# Patient Record
Sex: Male | Born: 1965 | Hispanic: Yes | Marital: Married | Smoking: Never smoker
Health system: Southern US, Community
[De-identification: ages and names within clinical notes are randomized; demographics above are authoritative.]

## PROBLEM LIST (undated history)

## (undated) DIAGNOSIS — I10 Essential (primary) hypertension: Secondary | ICD-10-CM

## (undated) DIAGNOSIS — R079 Chest pain, unspecified: Secondary | ICD-10-CM

## (undated) DIAGNOSIS — T7840XA Allergy, unspecified, initial encounter: Secondary | ICD-10-CM

## (undated) DIAGNOSIS — K219 Gastro-esophageal reflux disease without esophagitis: Secondary | ICD-10-CM

## (undated) DIAGNOSIS — U071 COVID-19: Secondary | ICD-10-CM

## (undated) DIAGNOSIS — E785 Hyperlipidemia, unspecified: Secondary | ICD-10-CM

## (undated) DIAGNOSIS — R0602 Shortness of breath: Secondary | ICD-10-CM

## (undated) DIAGNOSIS — A048 Other specified bacterial intestinal infections: Secondary | ICD-10-CM

## (undated) HISTORY — DX: Gastro-esophageal reflux disease without esophagitis: K21.9

## (undated) HISTORY — DX: Shortness of breath: R06.02

## (undated) HISTORY — DX: Essential (primary) hypertension: I10

## (undated) HISTORY — DX: Other specified bacterial intestinal infections: A04.8

## (undated) HISTORY — DX: Chest pain, unspecified: R07.9

## (undated) HISTORY — DX: Hyperlipidemia, unspecified: E78.5

## (undated) HISTORY — DX: Allergy, unspecified, initial encounter: T78.40XA

## (undated) HISTORY — DX: COVID-19: U07.1

---

## 2009-04-30 ENCOUNTER — Emergency Department (HOSPITAL_COMMUNITY): Admission: EM | Admit: 2009-04-30 | Discharge: 2009-04-30 | Payer: Self-pay | Admitting: Family Medicine

## 2012-05-12 ENCOUNTER — Ambulatory Visit: Payer: Self-pay | Admitting: Emergency Medicine

## 2012-05-12 VITALS — BP 124/76 | HR 107 | Temp 97.5°F | Resp 16 | Ht 66.0 in | Wt 182.0 lb

## 2012-05-12 DIAGNOSIS — G5680 Other specified mononeuropathies of unspecified upper limb: Secondary | ICD-10-CM

## 2012-05-12 MED ORDER — NAPROXEN SODIUM 550 MG PO TABS: 550.0000 mg | ORAL_TABLET | Freq: Two times a day (BID) | ORAL | Status: AC

## 2012-05-12 MED ORDER — TRAMADOL HCL 50 MG PO TABS: 50.0000 mg | ORAL_TABLET | Freq: Four times a day (QID) | ORAL | Status: DC | PRN

## 2012-05-12 MED ORDER — CYCLOBENZAPRINE HCL 10 MG PO TABS: 10.0000 mg | ORAL_TABLET | Freq: Three times a day (TID) | ORAL | Status: DC | PRN

## 2012-05-12 NOTE — Progress Notes (Signed)
   Date:  05/12/2012   Name:  Barry Fry   DOB:  11/19/1965   MRN:  [id number] Gender: male Age: 47 y.o.  PCP:  No primary provider on file.    Chief Complaint: Shoulder Pain and Headache   History of Present Illness:  Barry Fry is a 46 y.o. pleasant patient who presents with the following:  Awoke this am with pain in the left upper back medial to the angle of the scapula.  Non radiating. No neck or low back pain.  No neuro symptoms. No history of direct injury.  Works in Holiday representative.   Denies prior back of neck injury.    There is no problem list on file for this patient.   No past medical history on file.  No past surgical history on file.  History  Substance Use Topics  . Smoking status: Former Games developer  . Smokeless tobacco: Not on file  . Alcohol Use: Not on file    No family history on file.  No Known Allergies  Medication list has been reviewed and updated.  No outpatient prescriptions prior to visit.    Review of Systems:  As per HPI, otherwise negative.    Physical Examination: Filed Vitals:   05/12/12 1415  BP: 124/76  Pulse: 107  Temp: 97.5 F (36.4 C)  Resp: 16   Filed Vitals:   05/12/12 1415  Height: 5\' 6"  (1.676 m)  Weight: 182 lb (82.555 kg)   Body mass index is 29.38 kg/(m^2). Ideal Body Weight: Weight in (lb) to have BMI = 25: 154.6    GEN: WDWN, NAD, Non-toxic, Alert & Oriented x 3 HEENT: Atraumatic, Normocephalic.  Ears and Nose: No external deformity. EXTR: No clubbing/cyanosis/edema NEURO: Normal gait.  PSYCH: Normally interactive. Conversant. Not depressed or anxious appearing.  Calm demeanor.  Back:  Tender medial to angle of left scapula.  No crepitus or ecchymosis.  Neuro intact motor and sensory  Assessment and Plan: Scapulocostal syndrome. Flexeril Anaprox Ultram Local heat Follow up as needed  Carmelina Dane, MD

## 2012-05-12 NOTE — Patient Instructions (Addendum)
Dolor de espalda en el adulto (Back Pain, Adult) El dolor de cintura es frecuente. Aproximadamente 1 de cada 5 personas lo sufren.La causa rara vez pone en peligro la vida. Con frecuencia mejora luego de algn tiempo.Alrededor de la mitad de las personas que sufren un inicio sbito de dolor de cintura, se sentirn mejor luego de 2 semanas. Aproximadamente 8 de cada 10 se sentirn mejor luego de 6 semanas.  CAUSAS  Algunas causas comunes son:   Distensin de los msculos o ligamentos que sostienen la columna vertebral.   Desgaste (degeneracin) de los discos vertebrales.   Artritis   Traumatismos directos en la espalda.  DIAGNSTICO  La mayor parte de las veces, la causa directa no se conoce.Sin embargo, el dolor puede tratarse efectivamente an cuando no se conozca la causa.Una de las formas ms precisas de asegurar que la causa del dolor no constituye un peligro es responder a las preguntas del mdico acerca de su salud y sus sntomas. Si el mdico necesita ms informacin, podr indicar anlisis de laboratorio o realizar un diagnstico por imgenes (radiografas o resonancia magntica).Sin embargo, aunque las imgenes muestren modificaciones, generalmente no es necesaria la ciruga.  INSTRUCCIONES PARA EL CUIDADO EN EL HOGAR  En algunas personas, el dolor de espalda vuelve.Como rara vez es peligroso, los pacientes pueden aprender a manejarlo ellos mismos.   Mantngase activo. Si permanece sentado o de pie mucho tiempo en el mismo lugar, se tensiona la espalda.  No se siente, maneje ni se quede parado en un mismo lugar por ms de 30 minutos. Realice caminatas cortas en superficies planas ni bien el dolor haya cedido. Trate de aumentar cada da el tiempo que camina .   No se quede en la cama.Si hace reposo durante ms de 1 o 2 das, puede retrasar la recuperacin.   No evite los ejercicios ni el trabajo.El cuerpo est hecho para moverse.No es peligroso estar activo, aunque le duela la  espalda.La espalda se curar ms rpido si contina sus actividades antes de que el dolor se vaya.   Preste atencin a su cuerpo cuando se incline y se levante. Muchas personas sienten menos molestias cuando levantan objetos si doblan las rodillas, mantienen la carga cerca del cuerpo y evitan torcerse. Generalmente, las posiciones ms cmodas son las que ejercen menos tensin en la espalda en recuperacin.   Encuentre una posicin cmoda para dormir. Utilice un colchn firme y recustese de costado. Doble ligeramente sus rodillas. Si se recuesta sobre su espalda, coloque una almohada debajo de sus rodillas.   Tome slo medicamentos de venta libre o recetados, segn las indicaciones del mdico. Los medicamentos de venta libre para calmar el dolor y reducir la inflamacin, son los que en general ms ayudan.El mdico podr prescribirle relajantes musculares.Estos medicamentos calman el dolor de modo que pueda retornar a sus actividades normales y a realizar ejercicios saludables.   Aplique hielo sobre la zona lesionada.   Ponga el hielo en una bolsa plstica.   Colquese una toalla entre la piel y la bolsa de hielo.   Deje la bolsa de hielo durante 15 a 20minutos 3 a 4 veces por da, durante los primeros 2  3 das. Luego podr alternar entre calor y hielo para reducir el dolor y los espasmos.   Consulte a su mdico si puede tratar de hacer ejercicios para la espalda y recibir un masaje suave. Pueden ser beneficiosos.   Evite sentirse ansioso o estresado.El estrs aumenta la tensin muscular y puede empeorar el dolor   de espalda.Es importante reconocer cuando est ansioso o estresado y aprender la forma de controlarlos.El ejercicio es una gran opcin.  SOLICITE ATENCIN MDICA SI:   Siente un dolor que no se alivia con reposo o medicamentos.   El dolor no mejora en 1 semana.   Desarrolla nuevos sntomas.   No se siente bien en general.  SOLICITE ATENCIN MDICA DE INMEDIATO  SI:  Siente un dolor que se irradia desde la espalda hacia sus piernas.   Desarrolla nuevos problemas en el intestino o la vejiga.   Siente debilidad o adormecimiento inusual en sus brazos o piernas.   Presenta nuseas o vmitos.   Presenta dolor abdominal.   Se siente desfalleciente.  Document Released: 08/11/2005 Document Revised: 07/31/2011 [employer] Patient Information 2012 [employer]. 

## 2013-03-25 NOTE — Progress Notes (Signed)
This encounter was created in error - please disregard.

## 2013-06-09 ENCOUNTER — Ambulatory Visit: Payer: Self-pay | Admitting: Emergency Medicine

## 2013-06-09 VITALS — BP 132/80 | HR 83 | Temp 98.2°F | Resp 18 | Ht 66.5 in | Wt 185.0 lb

## 2013-06-09 DIAGNOSIS — S41101A Unspecified open wound of right upper arm, initial encounter: Secondary | ICD-10-CM

## 2013-06-09 DIAGNOSIS — S41109A Unspecified open wound of unspecified upper arm, initial encounter: Secondary | ICD-10-CM

## 2013-06-09 NOTE — Progress Notes (Signed)
   Patient ID: Jud Fanguy MRN: [id number], DOB: [birth year], 47 y.o. Date of Encounter: 06/09/2013, 1:42 PM   PROCEDURE NOTE: Verbal consent obtained.  Risks and benefits of the procedure were explained. Patient made an informed decision to proceed with the procedure. Sterile technique employed. Numbing: Anesthesia obtained with 1% lidocaine with epi, 6 cc. Wound 3.75 cm.   Cleansed with soap and water. Irrigated. Betadine prep per usual protocol.  Wound explored, no deep structures involved, no foreign bodies.   Wound repaired with # 7 simple interrupted sutures of 4-0 Prolene.  Hemostasis obtained. Wound cleansed and dressed.  Wound care instructions including precautions covered with patient. Handout given.  Anticipate suture removal in 10 days.   Signed, Eula Listen, PA-C [facility] [place], [place] [address] [phone] 06/09/2013 1:42 PM

## 2013-06-09 NOTE — Progress Notes (Signed)
  Subjective:  This chart was scribed for Barry Lites, MD by Karle Plumber, ED Scribe. This patient was seen in room 07 and the patient's care was started at 12:06 PM.   Patient ID: Barry Fry, male    DOB: [birth year], 47 y.o.   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 Pt states he was walking along wooden support beams and one was not nailed down properly and popped up and lacerated underneath his arm. He states that he is experiencing only mild pain at this time. He denies any head injury. He is unaware of his last tetanus vaccination. He denies any chronic illnesses or any other pertinent injuries.   Review of Systems  Skin: Positive for wound (right upper extremity).       Objective:   Physical Exam  Nursing note and vitals reviewed.   Triage Vitals: BP 132/80  Pulse 83  Temp(Src) 98.2 F (36.8 C) (Oral)  Resp 18  Ht 5' 6.5" (1.689 m)  Wt 185 lb (83.915 kg)  BMI 29.42 kg/m2  SpO2 98%      Assessment & Plan:  There were no encounter diagnoses.  DIAGNOSTIC STUDIES: Oxygen Saturation is 98% on RA, normal by my interpretation.   COORDINATION OF CARE: 12:15 PM- Will give ibuprofen for pain and clean and suture laceration. Pt verbalizes understanding and agrees to plan.  LACERATION REPAIR PROCEDURE NOTE The patient's identification was confirmed and consent was obtained. This procedure was performed by Eula Listen, PA-C at 12:20 PM. Site: right upper extremity Sterile procedures observed Anesthetic used (type and amt): Lidocaine 1% with epinephrine 6 mL Suture type/size:4-0 Prolene Length:3.75 cm # of Sutures:7  Technique:simple, interrupted Complexity simple Antibx ointment applied Tetanus ordered Site anesthetized, irrigated with NS, explored without evidence of foreign body, wound well approximated, site  covered with dry, sterile dressing.  Patient tolerated procedure well without complications. Instructions for care discussed verbally and patient provided with additional written instructions for homecare and f/u.

## 2013-06-22 ENCOUNTER — Ambulatory Visit (INDEPENDENT_AMBULATORY_CARE_PROVIDER_SITE_OTHER): Payer: Self-pay | Admitting: Physician Assistant

## 2013-06-22 ENCOUNTER — Encounter (INDEPENDENT_AMBULATORY_CARE_PROVIDER_SITE_OTHER): Payer: Self-pay

## 2013-06-22 VITALS — BP 122/80 | HR 86 | Temp 97.6°F | Resp 18 | Ht 66.5 in | Wt 185.0 lb

## 2013-06-22 DIAGNOSIS — Z4802 Encounter for removal of sutures: Secondary | ICD-10-CM

## 2013-06-22 NOTE — Progress Notes (Signed)
  Subjective:    Patient ID: Barry Fry, male    DOB: 10/05/1965, 47 y.o.   MRN: [id number]  Suture / Staple Removal      Barry Fry is a very pleasant 47 yr old male here for removal of sutures that were placed here on 06/09/13.  He reports he is doing well.  No pain or drainage.  No fever or chills.    Review of Systems  Constitutional: Negative for fever and chills.  Skin: Positive for wound. Negative for color change.       Objective:   Physical Exam  Vitals reviewed. Constitutional: He is oriented to person, place, and time. He appears well-developed and well-nourished. No distress.  HENT:  Head: Normocephalic and atraumatic.  Eyes: Conjunctivae are normal. No scleral icterus.  Pulmonary/Chest: Effort normal.  Neurological: He is alert and oriented to person, place, and time.  Skin: Skin is warm and dry.     Healing wound at right upper arm; no erythema or drainage; nontender; #7 sutures removed without difficulty; #3 steristrips applied across the wound; triple antibiotic ointment and bandages applied to two scabbed areas  Psychiatric: He has a normal mood and affect. His behavior is normal.         Assessment & Plan:  Visit for suture removal   Barry Fry is a very pleasant 47 yr old male here for suture removal.  Sutures removed as above.  Steri strips applied.  No need for further follow up unless concerns arise

## 2013-09-24 ENCOUNTER — Ambulatory Visit: Payer: Self-pay | Admitting: Family Medicine

## 2013-09-24 ENCOUNTER — Encounter: Payer: Self-pay | Admitting: Family Medicine

## 2013-09-24 VITALS — BP 120/72 | HR 68 | Temp 98.1°F | Resp 16 | Ht 66.0 in | Wt 179.4 lb

## 2013-09-24 DIAGNOSIS — K219 Gastro-esophageal reflux disease without esophagitis: Secondary | ICD-10-CM

## 2013-09-24 DIAGNOSIS — R071 Chest pain on breathing: Secondary | ICD-10-CM

## 2013-09-24 DIAGNOSIS — R0789 Other chest pain: Secondary | ICD-10-CM

## 2013-09-24 MED ORDER — DICLOFENAC SODIUM 75 MG PO TBEC: DELAYED_RELEASE_TABLET | ORAL | Status: DC

## 2013-09-24 MED ORDER — OMEPRAZOLE 40 MG PO CPDR: 40.0000 mg | DELAYED_RELEASE_CAPSULE | Freq: Every day | ORAL | Status: DC

## 2013-09-24 NOTE — Patient Instructions (Signed)
Take diclofenac one twice daily at breakfast and supper for chest wall pain  Take omeprazole 1 daily for acid reflux  If the little chest pains continued to persist return for recheck.  If you have severe chest pain or sweating or pain to the arm or nausea and vomiting go straight to the emergency room or call 911

## 2013-09-24 NOTE — Progress Notes (Signed)
Subjective: 48 year old man who has been having pains over the last few days in the left chest. It seems to be in the lateral left breast area he gets little twinges of pain shooting through. It has not been very severe. No nausea or vomiting. No diaphoresis. No referral of pain to the arm. No family history of heart disease. No history of cardiovascular problems such as hypertension. He does Holiday representativeconstruction work. The pain is not waking him at night. He does get pain at home. He had some he got up this morning. He does have some heartburn at times. He does not use alcohol or tobacco. Speaks adequate English.  Objective: Pleasant alert gentleman in no acute distress. He says it was hurting about 30 minutes ago but is not really hurting right this minute. His throat is clear. Neck supple without significant nodes. Chest is clear to auscultation. Heart regular without murmurs, gallops or arrhythmias. No chest wall tenderness. No chest wall lesions. No axillary nodes. Abdomen soft without masses or tenderness.  Assessment: Atypical chest pain  Plan: EKG  Ekg normal.   Assessment: Atypical chest pain, probably chest wall History GERD  Plan:  Diclofenac Omeprazole  Return if worse or the emergency room if necessary

## 2015-01-24 ENCOUNTER — Ambulatory Visit (INDEPENDENT_AMBULATORY_CARE_PROVIDER_SITE_OTHER): Payer: Self-pay | Admitting: Family Medicine

## 2015-01-24 VITALS — BP 132/80 | HR 104 | Temp 99.0°F | Resp 17 | Ht 65.6 in | Wt 174.0 lb

## 2015-01-24 DIAGNOSIS — Z79899 Other long term (current) drug therapy: Secondary | ICD-10-CM

## 2015-01-24 DIAGNOSIS — J309 Allergic rhinitis, unspecified: Secondary | ICD-10-CM

## 2015-01-24 DIAGNOSIS — J019 Acute sinusitis, unspecified: Secondary | ICD-10-CM

## 2015-01-24 DIAGNOSIS — J3489 Other specified disorders of nose and nasal sinuses: Secondary | ICD-10-CM

## 2015-01-24 LAB — GLUCOSE, POCT (MANUAL RESULT ENTRY): POC GLUCOSE: 124 mg/dL — AB (ref 70–99)

## 2015-01-24 MED ORDER — PREDNISONE 20 MG PO TABS: 40.0000 mg | ORAL_TABLET | Freq: Every day | ORAL | Status: DC

## 2015-01-24 MED ORDER — AMOXICILLIN-POT CLAVULANATE 875-125 MG PO TABS
1.0000 | ORAL_TABLET | Freq: Two times a day (BID) | ORAL | Status: DC
Start: 2015-01-24 — End: 2015-02-27

## 2015-01-24 NOTE — Patient Instructions (Addendum)
Creo que sus simptomas son a causa de Programmer, multimedia. Lea la informacion incluido. Continua nasocort y empiece zyrtec  QD para alergia. Deje de usar "Afrin" (en el futuro, no use este medicamento mas de 3 dias).  Si no mejora en 2-3 dias - empiece el antibiotico y prednisone. regrese a Event organiser o departmento de emergencia si empeorese.   Rinitis alrgica (Allergic Rhinitis) La rinitis alrgica ocurre cuando las membranas mucosas de la nariz responden a los alrgenos. Los alrgenos son las partculas que estn en el aire y que hacen que el cuerpo tenga una reaccin Counselling psychologist. Esto hace que usted libere anticuerpos alrgicos. A travs de una cadena de eventos, estos finalmente hacen que usted libere histamina en la corriente sangunea. Aunque la funcin de la histamina es proteger al organismo, es esta liberacin de histamina lo que provoca malestar, como los estornudos frecuentes, la congestin y goteo y Control and instrumentation engineer.  CAUSAS  La causa de la rinitis Merchandiser, retail (fiebre del heno) son los alrgenos del polen que pueden provenir del csped, los rboles y Theme park manager. La causa de la rinitis IT consultant (rinitis alrgica perenne) son los alrgenos como los caros del polvo domstico, la caspa de las mascotas y las esporas del moho.  SNTOMAS   Secrecin nasal (congestin).  Goteo y picazn nasales con estornudos y Arboriculturist. DIAGNSTICO  Su mdico puede ayudarlo a Warehouse manager alrgeno o los alrgenos que desencadenan sus sntomas. Si usted y su mdico no pueden Chief Strategy Officer cul es el alrgeno, pueden hacerse anlisis de sangre o estudios de la piel. TRATAMIENTO  La rinitis alrgica no tiene [place], pero puede controlarse mediante lo siguiente:  Medicamentos y vacunas contra la alergia (inmunoterapia).  Prevencin del alrgeno. La fiebre del heno a menudo puede tratarse con antihistamnicos en las formas de pldoras o aerosol nasal. Los antihistamnicos bloquean los efectos de la histamina.  Existen medicamentos de venta libre que pueden ayudar con la congestin nasal y la hinchazn alrededor de los ojos. Consulte a su mdico antes de tomar o administrarse este medicamento.  Si la prevencin del alrgeno o el medicamento recetado no dan resultado, existen muchos medicamentos nuevos que su mdico puede recetarle. Pueden usarse medicamentos ms fuertes si las medidas iniciales no son efectivas. Pueden aplicarse inyecciones desensibilizantes si los medicamentos y la prevencin no funcionan. La desensibilizacin ocurre cuando un paciente recibe vacunas constantes hasta que el cuerpo se vuelve menos sensible al alrgeno. Asegrese de Medical sales representative seguimiento con su mdico si los problemas continan. INSTRUCCIONES PARA EL CUIDADO EN EL HOGAR No es posible evitar por completo los alrgenos, pero puede reducir los sntomas al tomar medidas para limitar su exposicin a ellos. Es muy til saber exactamente a qu es alrgico para que pueda evitar sus desencadenantes especficos. SOLICITE ATENCIN MDICA SI:   Lance Muss.  Desarrolla una tos que no se detiene fcilmente (persistente).  Le falta el aire.  Comienza a tener sibilancias.  Los sntomas interfieren con las actividades diarias normales. Document Released: 05/21/2005 Document Revised: 06/01/2013 [facility] Patient Information 2015 [place], [place]. This information is not intended to replace advice given to you by your health care provider. Make sure you discuss any questions you have with your health care provider.  Sinusitis (Sinusitis) La sinusitis es el enrojecimiento, el dolor y la inflamacin de los senos paranasales. Los senos paranasales son bolsas de aire que se encuentran dentro de los huesos de la cara (por debajo de los ojos, en la mitad de la frente o por encima de  los ojos). En los senos paranasales sanos, el moco puede drenar y el aire circula a travs de ellos en su camino hacia la Clinical cytogeneticistnariz. Sin embargo, cuando se Burlingtoninflaman,  el moco y el aire [place] Vernonquedan atrapados. Esto hace que se desarrollen bacterias y otros grmenes que causan infeccin. La sinusitis puede desarrollarse rpidamente y durar solo un tiempo corto (aguda) o continuar por un perodo largo (crnica). La sinusitis que dura ms de 12 semanas se considera crnica.  CAUSAS  Las causas de la sinusitis son:  Cualquier alergia que tenga.  Las Liz Claiborneanomalas estructurales, como el desplazamiento del cartlago que separa las fosas nasales (desvo del tabique), que pueden disminuir el flujo de aire por la nariz y los senos paranasales, y Audiological scientistafectar su drenaje.  Las anomalas funcionales, como cuando los pequeos pelos (cilias) que se encuentran en los senos paranasales y que ayudan a eliminar el moco no funcionan correctamente o no estn presentes. SIGNOS Y SNTOMAS  Los sntomas de la sinusitis aguda y Myanmarcrnica son los mismos. Los sntomas principales son Chief Technology Officerel dolor y la presin alrededor de los senos paranasales afectados. Otros sntomas son:  Careers information officerDolor en los dientes superiores.  Dolor de odos.  Dolor de Turkmenistancabeza.  Mal aliento.  Disminucin del sentido del olfato y del gusto.  Tos, que empeora al D.R. Horton, Incacostarse.  Fatiga.  Grant RutsFiebre.  Drenaje de moco espeso por la nariz, que generalmente es de color verde y puede contener pus (purulento).  Hinchazn y calor en los senos paranasales afectados. DIAGNSTICO  Su mdico le Medical sales representativerealizar un examen fsico. Durante el examen, el mdico:  Le revisar la nariz para buscar signos de crecimientos anormales en las fosas nasales (plipos nasales).  Palpar los senos paranasales afectados para buscar signos de infeccin.  Ver el interior de los senos paranasales (endoscopia) a travs de un dispositivo de obtencin de imgenes que tiene una luz conectada (endoscopio). Si el mdico sospecha que usted sufre sinusitis crnica, podr indicar una o ms de las siguientes pruebas:  Pruebas de Programmer, multimediaalergia.  Cultivo de las Yahoosecreciones nasales. Se  extrae Lauris Poaguna muestra de moco de la nariz, que se enva al laboratorio para detectar bacterias.  Citologa nasal. Se extrae Lauris Poaguna muestra de moco de la nariz, que el mdico examina para determinar si la sinusitis est relacionada con Vella Raringuna alergia. TRATAMIENTO  La mayora de los casos de sinusitis aguda se deben a una infeccin viral y se resuelven espontneamente en un perodo de 10das. En algunos casos, se recetan medicamentos para Asbury Automotive Groupaliviar los sntomas (analgsicos, descongestivos, aerosoles nasales con corticoides o aerosoles salinos).  Sin embargo, para la sinusitis por infeccin bacteriana, Chief Operating Officerel mdico le recetar antibiticos. Los antibiticos son medicamentos que destruyen las bacterias que causan la infeccin.  Con poca frecuencia, la sinusitis tiene su origen en una infeccin por hongos. En estos casos, el mdico recetar un medicamento antimictico. Para algunos casos de sinusitis crnica, es necesario someterse a [place] and Herz[place]ovinauna ciruga. Generalmente, se trata de Engelhard Corporationcasos en los que la sinusitis se repite ms de 3veces al ao, a pesar de otros tratamientos. INSTRUCCIONES PARA EL CUIDADO EN EL HOGAR   Beber gran cantidad de lquidos. Los lquidos ayudan a Dillard'sdisolver el moco, para que drene ms fcilmente de los senos paranasales.  Use un humidificador.  Inhale vapor de 3 a 4 veces al da (por ejemplo, sintese en el bao con la ducha abierta).  Aplquese un pao tibio y hmedo en la cara 3 o 4 veces al da o segn las indicaciones del mdico.  Use  un aerosol nasal salino para ayudar a Environmental education officer y [employer] senos nasales.  Tome los medicamentos solamente como se lo haya indicado el mdico.  Si le recetaron un antimictico o un antibitico, asegrese de terminarlos, incluso si comienza a sentirse mejor. SOLICITE ATENCIN MDICA DE INMEDIATO SI:  Siente ms dolor o sufre dolores de cabeza intensos.  Tiene nuseas, vmitos o somnolencia.  Observa hinchazn alrededor del rostro.  Tiene problemas de  visin.  Presenta rigidez en el cuello.  Tiene dificultad para respirar. ASEGRESE DE QUE:   Comprende estas instrucciones.  Controlar su afeccin.  Recibir ayuda de inmediato si no mejora o si empeora. Document Released: 05/21/2005 Document Revised: 12/26/2013 [facility] Patient Information 2015 [place], [place]. This information is not intended to replace advice given to you by your health care provider. Make sure you discuss any questions you have with your health care provider.

## 2015-01-24 NOTE — Progress Notes (Addendum)
Subjective:    Patient ID: Barry Fry, male    DOB: [birth year], 49 y.o.   MRN: [id number] This chart was scribed for Meredith Staggers, MD by Jolene Provost, Medical Scribe. This patient was seen in Room 8 and the patient's care was started a 4:08 PM.  Chief Complaint  Patient presents with  . Sinusitis  . Nasal Congestion    HPI HPI Comments: Barry Fry is a 49 y.o. male with a past hx of allergies who presents to [facility] complaining of nasal congestion productive of clear drainage for the last two weeks with associated mild chest congestion, itchy eyes, and sneezing. Pt states his sx have been worse since yesterday. Pt states he has also had a mild right sided HA for the last two days. Pt has been taking a daytime cold and flu medication for several days, and nasocort that he started today. Pt also states he has been using afrin nasal spray for the last two weeks. Pt denies fever or sick contacts.   There are no active problems to display for this patient.  No past medical history on file. No past surgical history on file. No Known Allergies Prior to Admission medications   Medication Sig Start Date End Date Taking? Authorizing Provider  acetaminophen (TYLENOL) 325 MG tablet Take 650 mg by mouth every 6 (six) hours as needed.   Yes Historical Provider, MD  triamcinolone (NASACORT) 55 MCG/ACT AERO nasal inhaler Place 2 sprays into the nose daily.   Yes Historical Provider, MD   History   Social History  . Marital Status: Married    Spouse Name: N/A  . Number of Children: N/A  . Years of Education: N/A   Occupational History  . Not on file.   Social History Main Topics  . Smoking status: Never Smoker   . Smokeless tobacco: Not on file  . Alcohol Use: Not on file  . Drug Use: Not on file  . Sexual Activity: Not on file   Other Topics Concern  . Not on file   Social History Narrative    Review of Systems  Constitutional: Negative for fever and  chills.  HENT: Positive for congestion, sinus pressure and sneezing.   Eyes: Positive for itching. Negative for discharge.  Respiratory: Negative for cough and wheezing.   Neurological: Positive for headaches.       Objective:   Physical Exam  Constitutional: He is oriented to person, place, and time. He appears well-developed and well-nourished. No distress.  HENT:  Head: Normocephalic and atraumatic.  Edematous turbinates bilaterally. Sinuses non tender.   Eyes: Pupils are equal, round, and reactive to light.  Neck: Neck supple.  Cardiovascular: Normal rate, regular rhythm and normal heart sounds.   No murmur heard. Pulmonary/Chest: Effort normal and breath sounds normal. No respiratory distress. He has no wheezes.  Musculoskeletal: Normal range of motion.  Neurological: He is alert and oriented to person, place, and time. Coordination normal.  Skin: Skin is warm and dry. He is not diaphoretic.  Psychiatric: He has a normal mood and affect. His behavior is normal.  Nursing note and vitals reviewed.     Filed Vitals:   01/24/15 1517  BP: 132/80  Pulse: 104  Temp: 99 F (37.2 C)  TempSrc: Oral  Resp: 17  Height: 5' 5.6" (1.666 m)  Weight: 174 lb (78.926 kg)  SpO2: 97%    Results for orders placed or performed in visit on 01/24/15  POCT glucose (manual entry)  Result  Value Ref Range   POC Glucose 124 (A) 70 - 99 mg/dl   Not fasting - ate 3-4 hours ago.       Assessment & Plan:   Barry Fry is a 49 y.o. male Allergic rhinitis, unspecified allergic rhinitis type  Rhinorrhea  Acute sinusitis, recurrence not specified, unspecified location - Plan: amoxicillin-clavulanate (AUGMENTIN) 875-125 MG per tablet, predniSONE (DELTASONE) 20 MG tablet  High risk medication use - Plan: POCT glucose (manual entry)  Suspected allergic rhinitis, vs URI, vs early rhinitis medicamentosa from Afrin.   -add zyrtec to Nasocort, stop Afrin - risks discussed in future.     -if not improving in next few days - start prednisone  And Augmentin.  SED and rtc precautions discussed.   Language barrier - spanish spoken, understanding expressed   Meds ordered this encounter  Medications  . triamcinolone (NASACORT) 55 MCG/ACT AERO nasal inhaler    Sig: Place 2 sprays into the nose daily.  Marland Kitchen acetaminophen (TYLENOL) 325 MG tablet    Sig: Take 650 mg by mouth every 6 (six) hours as needed.  Marland Kitchen amoxicillin-clavulanate (AUGMENTIN) 875-125 MG per tablet    Sig: Take 1 tablet by mouth 2 (two) times daily.    Dispense:  20 tablet    Refill:  0    Label in spanish  . predniSONE (DELTASONE) 20 MG tablet    Sig: Take 2 tablets (40 mg total) by mouth daily with breakfast.    Dispense:  10 tablet    Refill:  0    Label in spanish   Patient Instructions  Creo que sus simptomas son a causa de Programmer, multimedia. Lea la informacion incluido. Continua nasocort y empiece zyrtec  QD para alergia. Deje de usar "Afrin" (en el futuro, no use este medicamento mas de 3 dias).  Si no mejora en 2-3 dias - empiece el antibiotico y prednisone. regrese a Event organiser o departmento de emergencia si empeorese.   Rinitis alrgica (Allergic Rhinitis) La rinitis alrgica ocurre cuando las membranas mucosas de la nariz responden a los alrgenos. Los alrgenos son las partculas que estn en el aire y que hacen que el cuerpo tenga una reaccin Counselling psychologist. Esto hace que usted libere anticuerpos alrgicos. A travs de una cadena de eventos, estos finalmente hacen que usted libere histamina en la corriente sangunea. Aunque la funcin de la histamina es proteger al organismo, es esta liberacin de histamina lo que provoca malestar, como los estornudos frecuentes, la congestin y goteo y Control and instrumentation engineer.  CAUSAS  La causa de la rinitis Merchandiser, retail (fiebre del heno) son los alrgenos del polen que pueden provenir del csped, los rboles y Theme park manager. La causa de la rinitis IT consultant (rinitis  alrgica perenne) son los alrgenos como los caros del polvo domstico, la caspa de las mascotas y las esporas del moho.  SNTOMAS   Secrecin nasal (congestin).  Goteo y picazn nasales con estornudos y Arboriculturist. DIAGNSTICO  Su mdico puede ayudarlo a Warehouse manager alrgeno o los alrgenos que desencadenan sus sntomas. Si usted y su mdico no pueden Chief Strategy Officer cul es el alrgeno, pueden hacerse anlisis de sangre o estudios de la piel. TRATAMIENTO  La rinitis alrgica no tiene [place], pero puede controlarse mediante lo siguiente:  Medicamentos y vacunas contra la alergia (inmunoterapia).  Prevencin del alrgeno. La fiebre del heno a menudo puede tratarse con antihistamnicos en las formas de pldoras o aerosol nasal. Los antihistamnicos bloquean los efectos de la histamina. Existen medicamentos de [employer]  pueden ayudar con la congestin nasal y la hinchazn alrededor de los ojos. Consulte a su mdico antes de tomar o administrarse este medicamento.  Si la prevencin del alrgeno o el medicamento recetado no dan resultado, existen muchos medicamentos nuevos que su mdico puede recetarle. Pueden usarse medicamentos ms fuertes si las medidas iniciales no son efectivas. Pueden aplicarse inyecciones desensibilizantes si los medicamentos y la prevencin no funcionan. La desensibilizacin ocurre cuando un paciente recibe vacunas constantes hasta que el cuerpo se vuelve menos sensible al alrgeno. Asegrese de Medical sales representative seguimiento con su mdico si los problemas continan. INSTRUCCIONES PARA EL CUIDADO EN EL HOGAR No es posible evitar por completo los alrgenos, pero puede reducir los sntomas al tomar medidas para limitar su exposicin a ellos. Es muy til saber exactamente a qu es alrgico para que pueda evitar sus desencadenantes especficos. SOLICITE ATENCIN MDICA SI:   Lance Muss.  Desarrolla una tos que no se detiene fcilmente (persistente).  Le falta el aire.  Comienza  a tener sibilancias.  Los sntomas interfieren con las actividades diarias normales. Document Released: 05/21/2005 Document Revised: 06/01/2013 [facility] Patient Information 2015 [place], [place]. This information is not intended to replace advice given to you by your health care provider. Make sure you discuss any questions you have with your health care provider.  Sinusitis (Sinusitis) La sinusitis es el enrojecimiento, el dolor y la inflamacin de los senos paranasales. Los senos paranasales son bolsas de aire que se encuentran dentro de los huesos de la cara (por debajo de los ojos, en la mitad de la frente o por encima de los ojos). En los senos paranasales sanos, el moco puede drenar y el aire circula a travs de ellos en su camino hacia la Clinical cytogeneticist. Sin embargo, cuando se [place], el moco y el aire [place]. Esto hace que se desarrollen bacterias y otros grmenes que causan infeccin. La sinusitis puede desarrollarse rpidamente y durar solo un tiempo corto (aguda) o continuar por un perodo largo (crnica). La sinusitis que dura ms de 12 semanas se considera crnica.  CAUSAS  Las causas de la sinusitis son:  Cualquier alergia que tenga.  Las [employer], como el desplazamiento del cartlago que separa las fosas nasales (desvo del tabique), que pueden disminuir el flujo de aire por la nariz y los senos paranasales, y Audiological scientist su drenaje.  Las anomalas funcionales, como cuando los pequeos pelos (cilias) que se encuentran en los senos paranasales y que ayudan a eliminar el moco no funcionan correctamente o no estn presentes. SIGNOS Y SNTOMAS  Los sntomas de la sinusitis aguda y [place] son los mismos. Los sntomas principales son Chief Technology Officer y la presin alrededor de los senos paranasales afectados. Otros sntomas son:  Careers information officer.  Dolor de odos.  Dolor de [place].  Mal aliento.  Disminucin del sentido del olfato y del gusto.  Tos, que  empeora al [employer]  Fatiga.  Grant Ruts.  Drenaje de moco espeso por la nariz, que generalmente es de color verde y puede contener pus (purulento).  Hinchazn y calor en los senos paranasales afectados. DIAGNSTICO  Su mdico le Medical sales representative examen fsico. Durante el examen, el mdico:  Le revisar la nariz para buscar signos de crecimientos anormales en las fosas nasales (plipos nasales).  Palpar los senos paranasales afectados para buscar signos de infeccin.  Ver el interior de los senos paranasales (endoscopia) a travs de un dispositivo de obtencin de imgenes que tiene una luz conectada (endoscopio). Si el mdico  sospecha que usted sufre sinusitis crnica, podr indicar una o ms de las siguientes pruebas:  Pruebas de Programmer, multimediaalergia.  Cultivo de las Yahoosecreciones nasales. Se extrae Lauris Poaguna muestra de moco de la nariz, que se enva al laboratorio para detectar bacterias.  Citologa nasal. Se extrae Lauris Poaguna muestra de moco de la nariz, que el mdico examina para determinar si la sinusitis est relacionada con Vella Raringuna alergia. TRATAMIENTO  La mayora de los casos de sinusitis aguda se deben a una infeccin viral y se resuelven espontneamente en un perodo de 10das. En algunos casos, se recetan medicamentos para Asbury Automotive Groupaliviar los sntomas (analgsicos, descongestivos, aerosoles nasales con corticoides o aerosoles salinos).  Sin embargo, para la sinusitis por infeccin bacteriana, Chief Operating Officerel mdico le recetar antibiticos. Los antibiticos son medicamentos que destruyen las bacterias que causan la infeccin.  Con poca frecuencia, la sinusitis tiene su origen en una infeccin por hongos. En estos casos, el mdico recetar un medicamento antimictico. Para algunos casos de sinusitis crnica, es necesario someterse a [place]una ciruga. Generalmente, se trata de Engelhard Corporationcasos en los que la sinusitis se repite ms de 3veces al ao, a pesar de otros tratamientos. INSTRUCCIONES PARA EL CUIDADO EN EL HOGAR   Beber gran cantidad de  lquidos. Los lquidos ayudan a Dillard'sdisolver el moco, para que drene ms fcilmente de los senos paranasales.  Use un humidificador.  Inhale vapor de 3 a 4 veces al da (por ejemplo, sintese en el bao con la ducha abierta).  Aplquese un pao tibio y hmedo en la cara 3 o 4 veces al da o segn las indicaciones del mdico.  Use un aerosol nasal salino para ayudar a Environmental education officerhumedecer y Duke Energylimpiar los senos nasales.  Tome los medicamentos solamente como se lo haya indicado el mdico.  Si le recetaron un antimictico o un antibitico, asegrese de terminarlos, incluso si comienza a sentirse mejor. SOLICITE ATENCIN MDICA DE INMEDIATO SI:  Siente ms dolor o sufre dolores de cabeza intensos.  Tiene nuseas, vmitos o somnolencia.  Observa hinchazn alrededor del rostro.  Tiene problemas de visin.  Presenta rigidez en el cuello.  Tiene dificultad para respirar. ASEGRESE DE QUE:   Comprende estas instrucciones.  Controlar su afeccin.  Recibir ayuda de inmediato si no mejora o si empeora. Document Released: 05/21/2005 Document Revised: 12/26/2013 [facility] Patient Information 2015 [place], [place]. This information is not intended to replace advice given to you by your health care provider. Make sure you discuss any questions you have with your health care provider.          I personally performed the services described in this documentation, which was scribed in my presence. The recorded information has been reviewed and considered, and addended by me as needed.

## 2015-02-27 ENCOUNTER — Ambulatory Visit (INDEPENDENT_AMBULATORY_CARE_PROVIDER_SITE_OTHER): Payer: Self-pay | Admitting: Physician Assistant

## 2015-02-27 VITALS — BP 120/80 | HR 87 | Temp 99.6°F | Resp 18 | Ht 67.0 in | Wt 175.8 lb

## 2015-02-27 DIAGNOSIS — Z1211 Encounter for screening for malignant neoplasm of colon: Secondary | ICD-10-CM

## 2015-02-27 DIAGNOSIS — R1012 Left upper quadrant pain: Secondary | ICD-10-CM

## 2015-02-27 DIAGNOSIS — Z87828 Personal history of other (healed) physical injury and trauma: Secondary | ICD-10-CM

## 2015-02-27 DIAGNOSIS — K219 Gastro-esophageal reflux disease without esophagitis: Secondary | ICD-10-CM

## 2015-02-27 LAB — POCT CBC
Granulocyte percent: 85.3 %G — AB (ref 37–80)
HCT, POC: 47.1 % (ref 43.5–53.7)
Hemoglobin: 15.5 g/dL (ref 14.1–18.1)
Lymph, poc: 0.8 (ref 0.6–3.4)
MCH: 28.6 pg (ref 27–31.2)
MCHC: 33 g/dL (ref 31.8–35.4)
MCV: 86.6 fL (ref 80–97)
MID (CBC): 0.2 (ref 0–0.9)
MPV: 7.2 fL (ref 0–99.8)
PLATELET COUNT, POC: 172 10*3/uL (ref 142–424)
POC Granulocyte: 6 (ref 2–6.9)
POC LYMPH PERCENT: 11.7 %L (ref 10–50)
POC MID %: 3 % (ref 0–12)
RBC: 5.43 M/uL (ref 4.69–6.13)
RDW, POC: 13.5 %
WBC: 7 10*3/uL (ref 4.6–10.2)

## 2015-02-27 LAB — POCT GLYCOSYLATED HEMOGLOBIN (HGB A1C): HEMOGLOBIN A1C: 5.4

## 2015-02-27 LAB — POCT URINALYSIS DIPSTICK
Bilirubin, UA: NEGATIVE
Blood, UA: NEGATIVE
Glucose, UA: NEGATIVE
KETONES UA: NEGATIVE
LEUKOCYTES UA: NEGATIVE
NITRITE UA: NEGATIVE
Spec Grav, UA: 1.02
Urobilinogen, UA: 0.2
pH, UA: 5.5

## 2015-02-27 LAB — COMPLETE METABOLIC PANEL WITH GFR
ALBUMIN: 4 g/dL (ref 3.5–5.2)
ALT: 18 U/L (ref 0–53)
AST: 17 U/L (ref 0–37)
Alkaline Phosphatase: 53 U/L (ref 39–117)
BUN: 12 mg/dL (ref 6–23)
CO2: 28 mEq/L (ref 19–32)
CREATININE: 0.89 mg/dL (ref 0.50–1.35)
Calcium: 9 mg/dL (ref 8.4–10.5)
Chloride: 97 mEq/L (ref 96–112)
GFR, Est Non African American: >89 mL/min
Glucose, Bld: 92 mg/dL (ref 70–99)
Potassium: 4.2 mEq/L (ref 3.5–5.3)
Sodium: 135 mEq/L (ref 135–145)
Total Bilirubin: 1.1 mg/dL (ref 0.2–1.2)
Total Protein: 6.7 g/dL (ref 6.0–8.3)

## 2015-02-27 MED ORDER — OMEPRAZOLE 20 MG PO CPDR: 20.0000 mg | DELAYED_RELEASE_CAPSULE | Freq: Every day | ORAL | Status: DC

## 2015-02-27 MED ORDER — RANITIDINE HCL 150 MG PO TABS
300.0000 mg | ORAL_TABLET | Freq: Once | ORAL | Status: AC
  Administered 2015-02-27: 300 mg via ORAL

## 2015-02-27 NOTE — Patient Instructions (Signed)
IF you do not feel better in 24-48 hours or become worse, please return to clinic or go directly to the ED.

## 2015-02-27 NOTE — Progress Notes (Signed)
02/27/2015 at 1:18 PM  Barry Fry / DOB: 11/27/1965 / MRN: [id number]  The patient  does not have a problem list on file.  SUBJECTIVE  Chief complaint: Abdominal Pain and Headache   Patient here today for RUQ pain and history of daily GERD symptoms. He reports his right upper qudrant pain started yesterday and he associates subjective fever with this pain. The pain is moderate and dull in nature.   He denies nausea, emesis, and a history of hematemesis.  He reports he has "heartburn every time he eats" and reports that he has been this way for 20 years.  He denies frank blood in the stool but does report some dark tarry stools.  He denies NSAID use and he has never been evaluated for ulcer.  He is a never smoker and does not consume alcohol.    He  has no past medical history on file.    Medications reviewed and updated by myself where necessary, and exist elsewhere in the encounter.   Barry Fry has No Known Allergies. He  reports that he has never smoked. He does not have any smokeless tobacco history on file. He  has no sexual activity history on file. The patient  has no past surgical history on file.  His family history is not on file.  Review of Systems  Constitutional: Positive for fever. Negative for chills, weight loss, malaise/fatigue and diaphoresis.  HENT: Negative for sore throat.   Respiratory: Negative for cough.   Cardiovascular: Negative for chest pain.  Gastrointestinal: Negative for nausea.  Genitourinary: Negative for dysuria.  Skin: Negative for itching and rash.  Neurological: Positive for headaches. Negative for dizziness and weakness.    OBJECTIVE  His  height is 5\' 7"  (1.702 m) and weight is 175 lb 12.8 oz (79.742 kg). His oral temperature is 100.6 F (38.1 C). His blood pressure is 120/80 and his pulse is 87. His respiration is 18 and oxygen saturation is 97%.  The patient's body mass index is 27.53 kg/(m^2).  Physical Exam  Vitals  reviewed. Constitutional: He is oriented to person, place, and time. He appears well-developed and well-nourished. No distress.  Cardiovascular: Normal rate and regular rhythm.   Respiratory: Effort normal and breath sounds normal.  GI: Soft. Bowel sounds are normal. He exhibits no distension. There is hepatomegaly. There is no hepatosplenomegaly or splenomegaly. There is tenderness (mild left upper and lower quadrant). There is no rebound, no guarding, no CVA tenderness, no tenderness at McBurney's point and negative Murphy's sign.  Neurological: He is alert and oriented to person, place, and time. No cranial nerve deficit. Coordination normal.  Skin: Skin is warm and dry. He is not diaphoretic.  Psychiatric: He has a normal mood and affect. His behavior is normal. Judgment and thought content normal.    Results for orders placed or performed in visit on 02/27/15 (from the past 24 hour(s))  POCT glycosylated hemoglobin (Hb A1C)     Status: None   Collection Time: 02/27/15  1:16 PM  Result Value Ref Range   Hemoglobin A1C 5.4   POCT CBC     Status: Abnormal   Collection Time: 02/27/15  1:16 PM  Result Value Ref Range   WBC 7.0 4.6 - 10.2 K/uL   Lymph, poc 0.8 0.6 - 3.4   POC LYMPH PERCENT 11.7 10 - 50 %L   MID (cbc) 0.2 0 - 0.9   POC MID % 3.0 0 - 12 %M   POC Granulocyte  6.0 2 - 6.9   Granulocyte percent 85.3 (A) 37 - 80 %G   RBC 5.43 4.69 - 6.13 M/uL   Hemoglobin 15.5 14.1 - 18.1 g/dL   HCT, POC 40.9 81.1 - 53.7 %   MCV 86.6 80 - 97 fL   MCH, POC 28.6 27 - 31.2 pg   MCHC 33.0 31.8 - 35.4 g/dL   RDW, POC 91.4 %   Platelet Count, POC 172 142 - 424 K/uL   MPV 7.2 0 - 99.8 fL  POCT urinalysis dipstick     Status: None   Collection Time: 02/27/15  1:16 PM  Result Value Ref Range   Color, UA yellow    Clarity, UA clear    Glucose, UA neg    Bilirubin, UA neg    Ketones, UA neg    Spec Grav, UA 1.020    Blood, UA neg    pH, UA 5.5    Protein, UA trace    Urobilinogen, UA 0.2      Nitrite, UA neg    Leukocytes, UA Negative Negative    ASSESSMENT & PLAN  Barry Fry was seen today for abdominal pain and headache.  Diagnoses and all orders for this visit:  Left upper quadrant pain: Unknown etiology at this point but he appears well and his exam is reassuring.  He is tolerating po fluids in the office without difficulty (1 gatorade and three waters) and his abdominal pain and headache have both resolved.   Orders: -     ranitidine (ZANTAC) tablet 300 mg; Take 2 tablets (300 mg total) by mouth once. -     COMPLETE METABOLIC PANEL WITH GFR -     POCT glycosylated hemoglobin (Hb A1C) -     POCT CBC -     POCT urinalysis dipstick  Gastroesophageal reflux disease, esophagitis presence not specified: Patient untreated for over 20 years now.  Treated with 300 mg of ranitidine in the office and his reflux has resolved.     The patient was advised to call or come back to clinic if he does not see an improvement in symptoms, or worsens with the above plan.   Deliah Boston, MHS, PA-C [facility] 02/27/2015 1:18 PM

## 2015-02-28 ENCOUNTER — Encounter: Payer: Self-pay | Admitting: Gastroenterology

## 2015-04-19 ENCOUNTER — Ambulatory Visit (INDEPENDENT_AMBULATORY_CARE_PROVIDER_SITE_OTHER): Payer: Self-pay | Admitting: Physician Assistant

## 2015-04-19 VITALS — BP 114/80 | HR 71 | Temp 98.0°F | Resp 16 | Ht 66.0 in | Wt 176.0 lb

## 2015-04-19 DIAGNOSIS — M791 Myalgia: Secondary | ICD-10-CM

## 2015-04-19 DIAGNOSIS — R1031 Right lower quadrant pain: Secondary | ICD-10-CM

## 2015-04-19 DIAGNOSIS — M7918 Myalgia, other site: Secondary | ICD-10-CM

## 2015-04-19 LAB — POCT URINALYSIS DIPSTICK
Bilirubin, UA: NEGATIVE
Blood, UA: NEGATIVE
Glucose, UA: NEGATIVE
KETONES UA: NEGATIVE
Leukocytes, UA: NEGATIVE
Nitrite, UA: NEGATIVE
PH UA: 7
Protein, UA: NEGATIVE
Spec Grav, UA: 1.02
Urobilinogen, UA: 1

## 2015-04-19 LAB — POCT UA - MICROSCOPIC ONLY
AMORPHOUS: POSITIVE
Bacteria, U Microscopic: NEGATIVE
CRYSTALS, UR, HPF, POC: NEGATIVE
Casts, Ur, LPF, POC: NEGATIVE
Epithelial cells, urine per micros: NEGATIVE
RBC, urine, microscopic: NEGATIVE
WBC, Ur, HPF, POC: NEGATIVE
YEAST UA: NEGATIVE

## 2015-04-19 NOTE — Patient Instructions (Signed)
Ibuprofen prn for pain. More likely musculoskeletal.

## 2015-04-19 NOTE — Progress Notes (Signed)
Subjective:    Patient ID: Barry Fry, male    DOB: 09-04-1965, 49 y.o.   MRN: [id number]  HPI Patient presents for right-sided groin pain that started yesterday. Had 3 separate episodes of sharp pain that last for 30 seconds to 1 minute. Additionally had pain this morning at work. Pain worse when lifting objects. Denies urinary pain, hematuria, frequency, or decreased stream, however, also has right sided lower back pain. Sexually active with females and uses condoms. Denies h/o hernia or kidney stones. NKDA.   Review of Systems  Constitutional: Negative for fever and chills.  Gastrointestinal: Negative for nausea, vomiting, abdominal pain, diarrhea and constipation.  Genitourinary: Negative for dysuria, urgency, frequency, hematuria, flank pain, decreased urine volume, discharge, penile swelling, scrotal swelling, genital sores, penile pain and testicular pain.       Right groin pain  Musculoskeletal: Positive for back pain. Negative for gait problem.       Objective:   Physical Exam  Constitutional: He is oriented to person, place, and time. He appears well-developed and well-nourished. No distress.  Blood pressure 114/80, pulse 71, temperature 98 F (36.7 C), temperature source Oral, resp. rate 16, height 5\' 6"  (1.676 m), weight 176 lb (79.833 kg), SpO2 98 %.  HENT:  Head: Normocephalic and atraumatic.  Right Ear: External ear normal.  Left Ear: External ear normal.  Eyes: Conjunctivae are normal. Right eye exhibits no discharge. Left eye exhibits no discharge. No scleral icterus.  Pulmonary/Chest: Effort normal.  Abdominal: Soft. Normal appearance and bowel sounds are normal. He exhibits no distension. There is no tenderness. There is CVA tenderness (right). There is no rebound and no guarding. No hernia. Hernia confirmed negative in the right inguinal area and confirmed negative in the left inguinal area.  Genitourinary: Penis normal. Right testis shows mass (possible  small hydrocele). Right testis shows no swelling and no tenderness. Right testis is descended. Left testis shows no mass, no swelling and no tenderness. Left testis is descended. Circumcised. No phimosis, paraphimosis, hypospadias, penile erythema or penile tenderness. No discharge found.  Pain not reproducible in groin  Lymphadenopathy:       Right: No inguinal adenopathy present.       Left: No inguinal adenopathy present.  Neurological: He is alert and oriented to person, place, and time.  Skin: He is not diaphoretic.  Psychiatric: He has a normal mood and affect. His behavior is normal. Judgment and thought content normal.   Results for orders placed or performed in visit on 04/19/15  POCT UA - Microscopic Only  Result Value Ref Range   WBC, Ur, HPF, POC neg    RBC, urine, microscopic neg    Bacteria, U Microscopic neg    Mucus, UA small    Epithelial cells, urine per micros neg    Crystals, Ur, HPF, POC neg    Casts, Ur, LPF, POC neg    Yeast, UA neg    Amorphous pos   POCT urinalysis dipstick  Result Value Ref Range   Color, UA yellow    Clarity, UA clear    Glucose, UA neg    Bilirubin, UA neg    Ketones, UA neg    Spec Grav, UA 1.020    Blood, UA neg    pH, UA 7.0    Protein, UA neg    Urobilinogen, UA 1.0    Nitrite, UA neg    Leukocytes, UA Negative Negative        Assessment & Plan:  1. Groin pain, right 2. Musculoskeletal pain Reminded to use proper lifting techniques at work. May use ibuprofen 400 mg every 6-8 hours prn. If right testicles growth secondary to hydrocele occurs and becomes uncomfortable should come in for refer to [place]. - POCT UA - Microscopic Only - POCT urinalysis dipstick     Janan Ridge PA-C  [facility] 04/19/2015 1:05 PM

## 2015-04-25 ENCOUNTER — Encounter: Payer: Self-pay | Admitting: Gastroenterology

## 2015-10-07 ENCOUNTER — Ambulatory Visit (INDEPENDENT_AMBULATORY_CARE_PROVIDER_SITE_OTHER): Payer: Self-pay | Admitting: Family Medicine

## 2015-10-07 VITALS — BP 120/80 | HR 90 | Temp 98.0°F | Resp 19 | Ht 66.0 in | Wt 178.2 lb

## 2015-10-07 DIAGNOSIS — J309 Allergic rhinitis, unspecified: Secondary | ICD-10-CM

## 2015-10-07 DIAGNOSIS — R0982 Postnasal drip: Secondary | ICD-10-CM

## 2015-10-07 DIAGNOSIS — J0140 Acute pansinusitis, unspecified: Secondary | ICD-10-CM

## 2015-10-07 MED ORDER — AMOXICILLIN 500 MG PO CAPS: 500.0000 mg | ORAL_CAPSULE | Freq: Two times a day (BID) | ORAL | Status: DC

## 2015-10-07 MED ORDER — PREDNISONE 20 MG PO TABS: ORAL_TABLET | ORAL | Status: DC

## 2015-10-07 NOTE — Patient Instructions (Signed)
Take zyrtec 10 mg over the counter daily in the morning For itchy eyes use ZADITOR antihistamine eye drops 2 times a day as needed Take Flonase in the morning, 1 spray per nostril Take Allegra in the night time   You have a prescription of prednisone at the pharmacy  IF no improvement and you have facial pain then may take amoxacillin ( antibiotic)   Sinusitis en adultos (Sinusitis, Adult) La sinusitis es el enrojecimiento, el dolor y la inflamacin de los senos paranasales. Los senos paranasales son cavidades de aire que estn dentro de los huesos de la cara. Se encuentran debajo de los ojos, en la mitad de la frente y encima de los ojos. En los senos paranasales sanos, el moco puede drenar y el aire circula a travs de ellos en su camino hacia la Clinical cytogeneticist. Sin embargo, cuando se [place], el moco y el aire [place]. Esto hace que se desarrollen bacterias y otros grmenes que causan infeccin. La sinusitis puede desarrollarse rpidamente y durar solo un tiempo corto (aguda) o continuar por un perodo largo (crnica). La sinusitis que dura ms de 12 semanas se considera crnica. CAUSAS Las causas de la sinusitis son:  [place].  Las [employer], como el desplazamiento del cartlago que separa las fosas nasales (desvo del tabique), que puede disminuir el flujo de aire por la nariz y los senos paranasales, y Audiological scientist su drenaje.  Las anomalas funcionales, como cuando los pequeos pelos (cilias) que se encuentran en los senos paranasales y ayudan a eliminar el moco no funcionan correctamente o no estn presentes. SIGNOS Y SNTOMAS Los sntomas de la sinusitis aguda y [place] son los mismos. Los sntomas principales son Chief Technology Officer y la presin alrededor de los senos paranasales afectados. Otros sntomas son:  Careers information officer.  Dolor de odos.  Dolor de [place].  Mal aliento.  Disminucin del sentido del olfato y del gusto.  Tos, que empeora al  [employer]  Fatiga.  Grant Ruts.  Drenaje de moco espeso por la nariz, que generalmente es de color verde y puede contener pus (purulento).  Hinchazn y calor en los senos paranasales afectados. DIAGNSTICO Su mdico Tasheema Perrone Medical sales representative examen fsico. Durante el examen, el mdico puede hacer cualquiera de estas cosas para determinar si usted tiene sinusitis aguda o crnica:  Clarence Dunsmore revisar la nariz para buscar signos de crecimientos anormales en las fosas nasales (plipos nasales).  Palpar los senos paranasales afectados para buscar signos de infeccin.  Observar la parte interna de los senos paranasales con un dispositivo que tiene una luz (endoscopio). Si el mdico sospecha que usted sufre sinusitis crnica, podr indicar una o ms de las siguientes pruebas:  Pruebas de Programmer, multimedia.  Cultivo de las Yahoo. Se extrae Lauris Poag de moco de la nariz, que se enva al laboratorio para detectar bacterias.  Citologa nasal. Se extrae Lauris Poag de moco de la nariz, que el mdico examina para determinar si la sinusitis est relacionada con Vella Raring. TRATAMIENTO La mayora de los casos de sinusitis aguda se deben a una infeccin viral y se resuelven espontneamente en un perodo de 10das. A veces, se recetan medicamentos como ayuda para [employer] sntomas tanto de la sinusitis aguda como de la crnica. Estos pueden incluir analgsicos, descongestivos, aerosoles nasales con corticoides o aerosoles de solucin salina. Sin embargo, para la sinusitis por infeccin bacteriana, Chief Operating Officer. Los antibiticos son medicamentos que destruyen las bacterias que causan la infeccin. Con poca frecuencia, la sinusitis  tiene su origen en una infeccin por hongos. En estos casos, Actor un medicamento antimictico. Para algunos casos de sinusitis crnica, es necesario someterse a [place]. Generalmente, se trata de [employer] la sinusitis se repite ms de  3veces al ao, a pesar de otros tratamientos. INSTRUCCIONES PARA EL CUIDADO EN EL HOGAR  Beber abundante agua. Los lquidos ayudan a [employer], para que drene ms fcilmente de los senos paranasales.  Use un humidificador.  Inhale vapor de 3a 4veces al da (por ejemplo, sintese en el bao con la [place]).  Aplquese un pao tibio y hmedo en la cara 3 o 4veces al da o segn las indicaciones del mdico.  Use un aerosol nasal salino para ayudar a Environmental education officer y [employer] senos nasales.  Tome los medicamentos solamente como se lo haya indicado el mdico.  Si Halayna Blane recetaron un antimictico o un antibitico, asegrese de terminarlos, incluso si comienza a sentirse mejor. SOLICITE ATENCIN MDICA DE INMEDIATO SI:  Siente ms dolor o sufre dolores de cabeza intensos.  Tiene nuseas, vmitos o somnolencia.  Observa hinchazn alrededor del rostro.  Tiene problemas de visin.  Presenta rigidez en el cuello.  Tiene dificultad para respirar.   Esta informacin no tiene Theme park manager el consejo del mdico. Asegrese de hacerle al mdico cualquier pregunta que tenga.   Document Released: 05/21/2005 Document Revised: 09/01/2014 Elsevier Interactive Patient Education [employer]

## 2015-10-07 NOTE — Progress Notes (Signed)
Chief Complaint:  Chief Complaint  Patient presents with  . Sinusitis    x 2 week    HPI: Barry Fry is a 50 y.o. male who reports to [name] today complaining of 2 week history of sinus sxs, facial pain white prodcutive sputum.  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 He ahs no hx of asthma.   Past Medical History  Diagnosis Date  . Allergy    No past surgical history on file. Social History   Social History  . Marital Status: Married    Spouse Name: N/A  . Number of Children: N/A  . Years of Education: N/A   Social History Main Topics  . Smoking status: Never Smoker   . Smokeless tobacco: None  . Alcohol Use: None  . Drug Use: No  . Sexual Activity: Yes    Birth Control/ Protection: Condom   Other Topics Concern  . None   Social History Narrative   No family history on file. No Known Allergies Prior to Admission medications   Medication Sig Start Date End Date Taking? Authorizing Provider  fluticasone (FLONASE) 50 MCG/ACT nasal spray Place into both nostrils daily.   Yes Historical Provider, MD     ROS: The patient denies fevers, chills, night sweats, unintentional weight loss, chest pain, palpitations, wheezing, dyspnea on exertion, nausea, vomiting, abdominal pain, dysuria, hematuria, melena, numbness, weakness, or tingling.   All other systems have been reviewed and were otherwise negative with the exception of those mentioned in the HPI and as above.    PHYSICAL EXAM: Filed Vitals:   10/07/15 1052  BP: 120/80  Pulse: 90  Temp: 98 F (36.7 C)  Resp: 19   Body mass index is 28.78 kg/(m^2).   General: Alert, no acute distress HEENT:  Normocephalic, atraumatic, oropharynx patent. EOMI, PERRLA Erythematous throat, no exudates, TM normal, + sinus tenderness, + erythematous/boggy nasal mucosa Cardiovascular:  Regular rate and rhythm, no rubs murmurs or gallops.  No  Carotid bruits, radial pulse intact. No pedal edema.  Respiratory: Clear to auscultation bilaterally.  No wheezes, rales, or rhonchi.  No cyanosis, no use of accessory musculature Abdominal: No organomegaly, abdomen is soft and non-tender, positive bowel sounds. No masses. Skin: No rashes. Neurologic: Facial musculature symmetric. Psychiatric: Patient acts appropriately throughout our interaction. Lymphatic: No cervical or submandibular lymphadenopathy Musculoskeletal: Gait intact. No edema, tenderness   LABS: Results for orders placed or performed in visit on 04/19/15  POCT UA - Microscopic Only  Result Value Ref Range   WBC, Ur, HPF, POC neg    RBC, urine, microscopic neg    Bacteria, U Microscopic neg    Mucus, UA small    Epithelial cells, urine per micros neg    Crystals, Ur, HPF, POC neg    Casts, Ur, LPF, POC neg    Yeast, UA neg    Amorphous pos   POCT urinalysis dipstick  Result Value Ref Range   Color, UA yellow    Clarity, UA clear    Glucose, UA neg    Bilirubin, UA neg    Ketones, UA neg    Spec Grav, UA 1.020    Blood, UA neg    pH, UA 7.0    Protein, UA neg    Urobilinogen, UA 1.0    Nitrite, UA neg    Leukocytes, UA Negative Negative     EKG/XRAY:   Primary read interpreted  by Dr. Conley Rolls at [facility].   ASSESSMENT/PLAN: Encounter Diagnoses  Name Primary?  . Acute pansinusitis, recurrence not specified Yes  . Allergic rhinitis, unspecified allergic rhinitis type   . Post-nasal drip    Cont with flonase, allegra Try zyrtec in am, zaditor prn  Rx prednisone, then if no improvement then use amoxacillin  Fu prn   Gross sideeffects, risk and benefits, and alternatives of medications d/w patient. Patient is aware that all medications have potential sideeffects and we are unable to predict every sideeffect or drug-drug interaction that may occur.  Sueanne Maniaci DO  10/07/2015 11:53 AM

## 2016-10-10 ENCOUNTER — Ambulatory Visit (INDEPENDENT_AMBULATORY_CARE_PROVIDER_SITE_OTHER): Payer: Self-pay | Admitting: Physician Assistant

## 2016-10-10 ENCOUNTER — Encounter: Payer: Self-pay | Admitting: Physician Assistant

## 2016-10-10 VITALS — BP 126/84 | HR 72 | Temp 98.3°F | Resp 16 | Ht 65.5 in | Wt 180.8 lb

## 2016-10-10 DIAGNOSIS — R350 Frequency of micturition: Secondary | ICD-10-CM

## 2016-10-10 DIAGNOSIS — Z23 Encounter for immunization: Secondary | ICD-10-CM

## 2016-10-10 LAB — POCT GLYCOSYLATED HEMOGLOBIN (HGB A1C): Hemoglobin A1C: 5.6

## 2016-10-10 LAB — POCT URINALYSIS DIP (MANUAL ENTRY)
BILIRUBIN UA: NEGATIVE
Glucose, UA: NEGATIVE
Ketones, POC UA: NEGATIVE
Leukocytes, UA: NEGATIVE
NITRITE UA: NEGATIVE
PH UA: 6
Protein Ur, POC: NEGATIVE
RBC UA: NEGATIVE
Spec Grav, UA: 1.025
Urobilinogen, UA: 1

## 2016-10-10 NOTE — Progress Notes (Signed)
10/13/2016 10:30 AM   DOB: November 23, 1965 / MRN: [id number]  SUBJECTIVE:  Barry Fry is a 51 y.o. male presenting for uri nary frequency that started yesterday. Complains that his urine is "really clear."  Denies a history of diabetes, CKD.  Does not take any prescription medications.  He has No Known Allergies.   He  has a past medical history of Allergy.    He  reports that he has never smoked. He has never used smokeless tobacco. He reports that he does not use drugs. He  reports that he currently engages in sexual activity. He reports using the following method of birth control/protection: Condom. The patient  has no past surgical history on file.  His family history is not on file.  Review of Systems  Constitutional: Negative for fever.  Gastrointestinal: Negative for abdominal pain and nausea.  Genitourinary: Positive for frequency and urgency. Negative for dysuria, flank pain and hematuria.  Skin: Negative for rash.  Neurological: Negative for dizziness.    The problem list and medications were reviewed and updated by myself where necessary and exist elsewhere in the encounter.   OBJECTIVE:  BP 126/84   Pulse 72   Temp 98.3 F (36.8 C) (Oral)   Resp 16   Ht 5' 5.5" (1.664 m)   Wt 180 lb 12.8 oz (82 kg)   SpO2 96%   BMI 29.63 kg/m   Physical Exam  Constitutional: He is oriented to person, place, and time.  Cardiovascular: Normal rate, regular rhythm and normal heart sounds.  Exam reveals no gallop, no friction rub and no decreased pulses.   No murmur heard. Pulmonary/Chest: Effort normal and breath sounds normal. No respiratory distress. He has no decreased breath sounds. He has no wheezes. He has no rhonchi. He has no rales.  Abdominal: Hernia confirmed negative in the right inguinal area and confirmed negative in the left inguinal area.  Genitourinary: Testes normal and penis normal. Right testis shows no mass and no tenderness. Left testis shows no mass and  no tenderness.  Musculoskeletal: He exhibits no edema.  Lymphadenopathy:       Right: No inguinal adenopathy present.  Neurological: He is alert and oriented to person, place, and time.  Skin: Skin is warm and dry. He is not diaphoretic.    Results for orders placed or performed in visit on 10/10/16 (from the past 72 hour(s))  POCT urinalysis dipstick     Status: None   Collection Time: 10/10/16  5:31 PM  Result Value Ref Range   Color, UA yellow yellow   Clarity, UA clear clear   Glucose, UA negative negative   Bilirubin, UA negative negative   Ketones, POC UA negative negative   Spec Grav, UA 1.025    Blood, UA negative negative   pH, UA 6.0    Protein Ur, POC negative negative   Urobilinogen, UA 1.0    Nitrite, UA Negative Negative   Leukocytes, UA Negative Negative  PSA     Status: None   Collection Time: 10/10/16  5:43 PM  Result Value Ref Range   Prostate Specific Ag, Serum 0.5 0.0 - 4.0 ng/mL    Comment: Roche ECLIA methodology. According to the American Urological Association, Serum PSA should decrease and remain at undetectable levels after radical prostatectomy. The AUA defines biochemical recurrence as an initial PSA value 0.2 ng/mL or greater followed by a subsequent confirmatory PSA value 0.2 ng/mL or greater. Values obtained with different assay methods or kits cannot be  used interchangeably. Results cannot be interpreted as absolute evidence of the presence or absence of malignant disease.   GC/Chlamydia Probe Amp     Status: None   Collection Time: 10/10/16  5:43 PM  Result Value Ref Range   Chlamydia trachomatis, NAA Negative Negative   Neisseria gonorrhoeae by PCR Negative Negative  Basic metabolic panel     Status: Abnormal   Collection Time: 10/10/16  5:43 PM  Result Value Ref Range   Glucose 110 (H) 65 - 99 mg/dL    Comment: Specimen received in contact with cells. No visible hemolysis present. However GLUC may be decreased and K increased.  Clinical correlation indicated.    BUN 12 6 - 24 mg/dL   Creatinine, Ser 1.61 (L) 0.76 - 1.27 mg/dL   GFR calc non Af Amer 106 >59 mL/min/1.73   GFR calc Af Amer 123 >59 mL/min/1.73   BUN/Creatinine Ratio 16 9 - 20   Sodium 140 134 - 144 mmol/L   Potassium 4.4 3.5 - 5.2 mmol/L    Comment: Specimen received in contact with cells. No visible hemolysis present. However GLUC may be decreased and K increased. Clinical correlation indicated.    Chloride 100 96 - 106 mmol/L   CO2 20 18 - 29 mmol/L   Calcium 9.3 8.7 - 10.2 mg/dL  Specimen status report     Status: None   Collection Time: 10/10/16  5:43 PM  Result Value Ref Range   specimen status report Comment     Comment: Written Authorization Written Authorization Written Authorization Received. Authorization received from ORIGINAL req 10-12-2016 Logged by Rosanna Randy   Basic metabolic panel     Status: None   Collection Time: 10/10/16  5:48 PM  Result Value Ref Range   Glucose CANCELED mg/dL    Comment: Please refer to the following specimen for additional lab results. see [id number]  Result canceled by the ancillary    BUN CANCELED     Comment: Test not performed  Result canceled by the ancillary    Creatinine, Ser CANCELED     Comment: Test not performed  Result canceled by the ancillary    Sodium CANCELED     Comment: Test not performed  Result canceled by the ancillary    Potassium CANCELED     Comment: Test not performed  Result canceled by the ancillary    Chloride CANCELED     Comment: Test not performed  Result canceled by the ancillary    CO2 CANCELED     Comment: Test not performed  Result canceled by the ancillary    Calcium CANCELED     Comment: Test not performed  Result canceled by the ancillary   POCT glycosylated hemoglobin (Hb A1C)     Status: None   Collection Time: 10/10/16  5:56 PM  Result Value Ref Range   Hemoglobin A1C 5.6     No results found.  ASSESSMENT AND  PLAN:  Barry Fry was seen today for frequent urination and flu shot.  Diagnoses and all orders for this visit:  Urinary frequency: His work up is negative. Will advise that we give this problem more time to resolve on its own.  -     POCT urinalysis dipstick -     PSA -     GC/Chlamydia Probe Amp -     Trichomonas vaginalis, RNA -     POCT glycosylated hemoglobin (Hb A1C) -     Basic metabolic panel  Needs  flu shot -     Flu Vaccine QUAD 36+ mos IM      The patient is advised to call or return to clinic if he does not see an improvement in symptoms, or to seek the care of the closest emergency department if he worsens with the above plan.   Deliah BostonMichael Clark, MHS, PA-C [facility] 10/13/2016 10:30 AM

## 2016-10-10 NOTE — Patient Instructions (Signed)
     IF you received an x-ray today, you will receive an invoice from [facility]. Please contact [facility] at [phone] with questions or concerns regarding your invoice.   IF you received labwork today, you will receive an invoice from [facility]. Please contact [facility] at [phone] with questions or concerns regarding your invoice.   Our billing staff will not be able to assist you with questions regarding bills from these companies.  You will be contacted with the lab results as soon as they are available. The fastest way to get your results is to activate your My Chart account. Instructions are located on the last page of this paperwork. If you have not heard from us regarding the results in 2 weeks, please contact this office.     

## 2016-10-11 LAB — BASIC METABOLIC PANEL

## 2016-10-11 LAB — PSA: PROSTATE SPECIFIC AG, SERUM: 0.5 ng/mL (ref 0.0–4.0)

## 2016-10-13 LAB — BASIC METABOLIC PANEL
BUN / CREAT RATIO: 16 (ref 9–20)
BUN: 12 mg/dL (ref 6–24)
CO2: 20 mmol/L (ref 18–29)
CREATININE: 0.74 mg/dL — AB (ref 0.76–1.27)
Calcium: 9.3 mg/dL (ref 8.7–10.2)
Chloride: 100 mmol/L (ref 96–106)
GFR calc Af Amer: 123 mL/min/{1.73_m2} (ref >59)
GFR, EST NON AFRICAN AMERICAN: 106 mL/min/{1.73_m2} (ref >59)
Glucose: 110 mg/dL — ABNORMAL HIGH (ref 65–99)
Potassium: 4.4 mmol/L (ref 3.5–5.2)
SODIUM: 140 mmol/L (ref 134–144)

## 2016-10-13 LAB — GC/CHLAMYDIA PROBE AMP
Chlamydia trachomatis, NAA: NEGATIVE
Neisseria gonorrhoeae by PCR: NEGATIVE

## 2016-10-13 LAB — SPECIMEN STATUS REPORT

## 2016-10-14 LAB — TRICHOMONAS VAGINALIS, PROBE AMP: TRICH VAG BY NAA: NEGATIVE

## 2016-10-14 NOTE — Progress Notes (Signed)
Labs look great. We can give his symtpoms time to resolve on their own if they have not already. Please send a letter. Deliah BostonMichael Kaelene Elliston, MS, PA-C 2:17 PM, 10/14/2016

## 2017-05-30 ENCOUNTER — Ambulatory Visit (INDEPENDENT_AMBULATORY_CARE_PROVIDER_SITE_OTHER): Payer: Self-pay | Admitting: Physician Assistant

## 2017-05-30 ENCOUNTER — Encounter: Payer: Self-pay | Admitting: Physician Assistant

## 2017-05-30 VITALS — BP 114/76 | HR 71 | Temp 98.2°F | Resp 18 | Ht 66.34 in | Wt 186.6 lb

## 2017-05-30 DIAGNOSIS — R12 Heartburn: Secondary | ICD-10-CM

## 2017-05-30 MED ORDER — OMEPRAZOLE 40 MG PO CPDR: 40.0000 mg | DELAYED_RELEASE_CAPSULE | Freq: Every day | ORAL | 3 refills | Status: AC

## 2017-05-30 NOTE — Patient Instructions (Addendum)
   IF you received an x-ray today, you will receive an invoice from [facility]. Please contact [facility] at [phone] with questions or concerns regarding your invoice.   IF you received labwork today, you will receive an invoice from [facility]. Please contact [facility] at [phone] with questions or concerns regarding your invoice.   Our billing staff will not be able to assist you with questions regarding bills from these companies.  You will be contacted with the lab results as soon as they are available. The fastest way to get your results is to activate your My Chart account. Instructions are located on the last page of this paperwork. If you have not heard from us regarding the results in 2 weeks, please contact this office.     Enfermedad por reflujo gastroesofgico en los adultos (Gastroesophageal Reflux Disease, Adult) Normalmente, los alimentos descienden por el esfago y se depositan en el estmago para su digestin. Si una persona tiene enfermedad por reflujo gastroesofgico (ERGE), los alimentos y el cido estomacal regresan al esfago. Cuando esto ocurre, el esfago se irrita y se hincha (inflama). Con el tiempo, la ERGE puede provocar la formacin de pequeas perforaciones (lceras) en la mucosa del esfago. CUIDADOS EN EL HOGAR Dieta  Siga la dieta como se lo haya indicado el mdico. Tal vez deba evitar los siguientes alimentos y bebidas: ? Caf y t (con o sin cafena). ? Bebidas que contengan alcohol. ? Bebidas energizantes y deportivas. ? Gaseosas o refrescos. ? Chocolate y cacao. ? Menta y esencias de menta. ? Ajo y cebollas. ? Rbano picante. ? Alimentos muy condimentados y cidos, como pimientos, chile en polvo, curry en polvo, vinagre, salsas picantes y salsa barbacoa. ? Frutas ctricas y sus jugos, como naranjas, limones y limas. ? Alimentos a base de tomates, como salsa roja, chile, salsa y pizza con salsa roja. ? Alimentos fritos y  grasos, como rosquillas, papas fritas y aderezos con alto contenido de grasa. ? Carnes con alto contenido de grasa, como hot dogs, filetes de entrecot, salchicha, jamn y tocino. ? Productos lcteos con alto contenido de grasa, como leche entera, mantequilla y queso crema.  Consuma pequeas porciones de comida con ms frecuencia. Evite consumir porciones abundantes.  Evite beber mucho lquido con las comidas.  No coma durante las 2 o 3horas previas a la hora de acostarse.  No se acueste inmediatamente despus de comer.  No haga actividad fsica enseguida despus de comer. Instrucciones generales  Est atento a cualquier cambio en los sntomas.  Tome los medicamentos de venta libre y los recetados solamente como se lo haya indicado el mdico. No tome aspirina, ibuprofeno ni otros antiinflamatorios no esteroides (AINE), a menos que el mdico lo autorice.  No consuma ningn producto que contenga tabaco, lo que incluye cigarrillos, tabaco de mascar y cigarrillos electrnicos. Si necesita ayuda para dejar de fumar, consulte al mdico.  Use ropa suelta. No use nada ajustado alrededor de la cintura.  Levante (eleve) unas 6pulgadas (15centmetros) la cabecera de la cama.  Intente bajar el nivel de estrs. Si necesita ayuda para hacerlo, consulte al mdico.  Si tiene sobrepeso, adelgace hasta alcanzar un peso saludable. Pregntele a su mdico cmo puede perder peso de manera segura.  Concurra a todas las visitas de control como se lo haya indicado el mdico. Esto es importante. SOLICITE AYUDA SI:  Aparecen nuevos sntomas.  Baja de peso y no sabe por qu.  Tiene dificultad para tragar o siente dolor al hacerlo.  Tiene   sibilancias o tos que no desaparece.  Los sntomas no mejoran con el tratamiento.  Tiene la voz ronca. SOLICITE AYUDA DE INMEDIATO SI:  Tiene dolor en los brazos, el cuello, los maxilares, la dentadura o la espalda.  Transpira, se marea o tiene sensacin de  desvanecimiento.  Siente falta de aire o dolor en el pecho.  Vomita y el vmito es parecido a la sangre o a los granos de caf.  Pierde el conocimiento (se desmaya).  Las heces son sanguinolentas o de color negro.  No puede tragar, beber o comer. Esta informacin no tiene como fin reemplazar el consejo del mdico. Asegrese de hacerle al mdico cualquier pregunta que tenga. Document Released: 09/13/2010 Document Revised: 05/02/2015 Document Reviewed: 12/06/2014 Elsevier Interactive Patient Education  2018 Elsevier Inc.  

## 2017-05-30 NOTE — Progress Notes (Signed)
   Barry Fry  MRN: [id number] DOB: 12-02-1965  PCP: Patient, No Pcp Per  Chief Complaint  Patient presents with  . Heartburn    X 4 weeks- having frequent heartburn    Subjective:  Pt presents to clinic for concerns about heartburn which he has had for a long time - now it is starting to bother his sleep.  Spicy and greasy foods make it worse.  He does not drink ETOH.  He has tried [place] which helps a small amount sometimes. He has been on prilosec in the past and it helped his symptoms but when he went off the symptoms returned.  History is obtained by patient.  [employer] interpretors used - [place] [id number]  Review of Systems  Gastrointestinal: Positive for constipation (intermittent). Negative for diarrhea, nausea and vomiting.    There are no active problems to display for this patient.   No current outpatient prescriptions on file prior to visit.   No current facility-administered medications on file prior to visit.     No Known Allergies  Past Medical History:  Diagnosis Date  . Allergy    Social History   Social History Narrative  . No narrative on file   Social History  Substance Use Topics  . Smoking status: Never Smoker  . Smokeless tobacco: Never Used  . Alcohol use No   family history is not on file.     Objective:  BP 114/76 (BP Location: Left Arm, Patient Position: Sitting, Cuff Size: Normal)   Pulse 71   Temp 98.2 F (36.8 C) (Oral)   Resp 18   Ht 5' 6.34" (1.685 m)   Wt 186 lb 9.6 oz (84.6 kg)   SpO2 97%   BMI 29.81 kg/m  Body mass index is 29.81 kg/m.  Physical Exam  Constitutional: He is oriented to person, place, and time and well-developed, well-nourished, and in no distress.  HENT:  Head: Normocephalic and atraumatic.  Right Ear: External ear normal.  Left Ear: External ear normal.  Eyes: Conjunctivae are normal.  Neck: Normal range of motion.  Cardiovascular: Normal rate, regular rhythm and normal heart sounds.   No  murmur heard. Pulmonary/Chest: Effort normal and breath sounds normal. He has no wheezes.  Abdominal: Soft. Normal appearance. There is tenderness (mild TTP epigastric area) in the epigastric area. There is negative Murphy's sign.  Neurological: He is alert and oriented to person, place, and time. Gait normal.  Skin: Skin is warm and dry.  Psychiatric: Mood, memory, affect and judgment normal.    Assessment and Plan :  Heartburn - Plan: omeprazole (PRILOSEC) 40 MG capsule - restart medication that has helped him in the past - if it does not help this time the patient will need a H pylori test after he has been off the PPI for 2 weeks.  He will stop after 3 months and if his symptoms are gone great and if not he can be given another 3 month supply.  We talked about lifestyle changes to help with his symptoms.  Benny Lennert PA-C  Primary Care at [facility] 05/30/2017 3:38 PM

## 2017-09-25 HISTORY — PX: EYE SURGERY: SHX253

## 2017-10-30 HISTORY — PX: EYE SURGERY: SHX253

## 2017-12-10 ENCOUNTER — Ambulatory Visit (INDEPENDENT_AMBULATORY_CARE_PROVIDER_SITE_OTHER): Payer: Self-pay

## 2017-12-10 ENCOUNTER — Ambulatory Visit: Payer: Self-pay | Admitting: Physician Assistant

## 2017-12-10 ENCOUNTER — Encounter: Payer: Self-pay | Admitting: Physician Assistant

## 2017-12-10 ENCOUNTER — Other Ambulatory Visit: Payer: Self-pay

## 2017-12-10 VITALS — BP 130/84 | HR 89 | Temp 97.8°F | Resp 18 | Ht 66.18 in | Wt 176.8 lb

## 2017-12-10 DIAGNOSIS — M940 Chondrocostal junction syndrome [Tietze]: Secondary | ICD-10-CM

## 2017-12-10 DIAGNOSIS — T1490XA Injury, unspecified, initial encounter: Secondary | ICD-10-CM

## 2017-12-10 DIAGNOSIS — M791 Myalgia, unspecified site: Secondary | ICD-10-CM

## 2017-12-10 MED ORDER — CYCLOBENZAPRINE HCL 5 MG PO TABS: 5.0000 mg | ORAL_TABLET | Freq: Three times a day (TID) | ORAL | 0 refills | Status: DC | PRN

## 2017-12-10 MED ORDER — NAPROXEN 500 MG PO TABS
500.0000 mg | ORAL_TABLET | Freq: Two times a day (BID) | ORAL | 0 refills | Status: DC
Start: 2017-12-10 — End: 2022-05-28
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
## 2017-12-10 NOTE — Patient Instructions (Addendum)
Your x-rays do not show any acute injuries.  This is all likely muscular strain and pain.  I recommend resting.  Avoid heavy lifting.  Apply heat or ice to affected area.  I have given you two medications to use for pain.  Just to know, flexeril can cause side effects that may impair your thinking or reactions. Be careful if you drive or do anything that requires you to be awake and alert. void drinking alcohol, which can increase some of the side effects of Flexeril.  NSAIDs like meloxicam have common side effects of heartburn, stomach pain, indigestion, and headache. Could lead to renal insufficiency, stroke, or GI bleed if taken excess amounts outside of what is recommended on label long term.     IF you received an x-ray today, you will receive an invoice from [facility]. Please contact [facility] at [phone] with questions or concerns regarding your invoice.   IF you received labwork today, you will receive an invoice from [facility]. Please contact [facility] at [phone] with questions or concerns regarding your invoice.   Our billing staff will not be able to assist you with questions regarding bills from these companies.  You will be contacted with the lab results as soon as they are available. The fastest way to get your results is to activate your My Chart account. Instructions are located on the last page of this paperwork. If you have not heard from us regarding the results in 2 weeks, please contact this office.

## 2017-12-17 ENCOUNTER — Encounter: Payer: Self-pay | Admitting: Physician Assistant

## 2017-12-21 ENCOUNTER — Emergency Department (HOSPITAL_COMMUNITY): Payer: Self-pay

## 2017-12-21 ENCOUNTER — Encounter (HOSPITAL_COMMUNITY): Payer: Self-pay | Admitting: Emergency Medicine

## 2017-12-21 ENCOUNTER — Emergency Department (HOSPITAL_COMMUNITY)
Admission: EM | Admit: 2017-12-21 | Discharge: 2017-12-21 | Disposition: A | Discharge status: Home / Self Care | Payer: Self-pay | Attending: Emergency Medicine | Admitting: Emergency Medicine

## 2017-12-21 DIAGNOSIS — Z79899 Other long term (current) drug therapy: Secondary | ICD-10-CM | POA: Insufficient documentation

## 2017-12-21 DIAGNOSIS — G510 Bell's palsy: Secondary | ICD-10-CM | POA: Insufficient documentation

## 2017-12-21 LAB — BASIC METABOLIC PANEL
Anion gap: 8 (ref 5–15)
BUN: 24 mg/dL — AB (ref 6–20)
CHLORIDE: 103 mmol/L (ref 101–111)
CO2: 25 mmol/L (ref 22–32)
CREATININE: 0.9 mg/dL (ref 0.61–1.24)
Calcium: 8.9 mg/dL (ref 8.9–10.3)
GFR calc non Af Amer: >60 mL/min (ref >60)
Glucose, Bld: 111 mg/dL — ABNORMAL HIGH (ref 65–99)
POTASSIUM: 3.7 mmol/L (ref 3.5–5.1)
SODIUM: 136 mmol/L (ref 135–145)

## 2017-12-21 LAB — CBC
HEMATOCRIT: 43.6 % (ref 39.0–52.0)
HEMOGLOBIN: 14.7 g/dL (ref 13.0–17.0)
MCH: 29.3 pg (ref 26.0–34.0)
MCHC: 33.7 g/dL (ref 30.0–36.0)
MCV: 87 fL (ref 78.0–100.0)
Platelets: 190 10*3/uL (ref 150–400)
RBC: 5.01 MIL/uL (ref 4.22–5.81)
RDW: 13 % (ref 11.5–15.5)
WBC: 7.1 10*3/uL (ref 4.0–10.5)

## 2017-12-21 MED ORDER — PREDNISONE 10 MG PO TABS: 20.0000 mg | ORAL_TABLET | Freq: Two times a day (BID) | ORAL | 0 refills | Status: DC

## 2017-12-21 MED ORDER — VALACYCLOVIR HCL 1 G PO TABS: 1000.0000 mg | ORAL_TABLET | Freq: Three times a day (TID) | ORAL | 0 refills | Status: DC

## 2017-12-21 NOTE — ED Triage Notes (Signed)
Brought by family due to left side facial droop that started around 2345.  Patient also reports having difficulty swallowing around 6-7pm.  No weakness noted.  Also c/o headache.

## 2017-12-21 NOTE — Discharge Instructions (Addendum)
Prednisone and acyclovir as prescribed.  Follow-up with your primary doctor if symptoms are not improving in the next week, and return to the ER if symptoms significantly worsen or change.

## 2017-12-21 NOTE — ED Provider Notes (Signed)
[facility] EMERGENCY DEPARTMENT Provider Note   CSN: [id number] Arrival date & time: 12/21/17  0007     History   Chief Complaint Chief Complaint  Patient presents with  . Stroke Symptoms    HPI Barry Fry is a 52 y.o. male.  Patient is a 52 year old male with no significant past medical history.  He presents today for evaluation of left-sided facial droop.  This started this evening at approximately 11:45 PM.  His wife noticed that the left side of his mouth was drooping.  He reports earlier in the day feeling as though he was having difficulty swallowing.  He denies any involvement of his arm or leg.  He denies any headache or visual disturbances.  There are no aggravating or alleviating factors.  The history is provided by the patient.    Past Medical History:  Diagnosis Date  . Allergy     There are no active problems to display for this patient.   Past Surgical History:  Procedure Laterality Date  . EYE SURGERY Right 10/30/2017  . EYE SURGERY Left 09/2017        Home Medications    Prior to Admission medications   Medication Sig Start Date End Date Taking? Authorizing Provider  cyclobenzaprine (FLEXERIL) 5 MG tablet Take 1 tablet (5 mg total) by mouth 3 (three) times daily as needed for muscle spasms. 12/10/17   Benjiman Core D, PA-C  naproxen (NAPROSYN) 500 MG tablet Take 1 tablet (500 mg total) by mouth 2 (two) times daily with a meal. 12/10/17   Barnett Abu, [place] D, PA-C  omeprazole (PRILOSEC) 40 MG capsule Take 1 capsule (40 mg total) by mouth daily. 05/30/17   Valarie Cones, Dema Severin, PA-C    Family History No family history on file.  Social History Social History   Tobacco Use  . Smoking status: Never Smoker  . Smokeless tobacco: Never Used  Substance Use Topics  . Alcohol use: No  . Drug use: No     Allergies   Patient has no known allergies.   Review of Systems Review of Systems  All other systems reviewed and  are negative.    Physical Exam Updated Vital Signs BP (!) 136/96 (BP Location: Right Arm)   Pulse 75   Temp 98 F (36.7 C) (Oral)   Resp 16   Ht  (1.676 m)   Wt 80.7 kg (178 lb)   SpO2 97%   BMI 28.73 kg/m   Physical Exam  Constitutional: He is oriented to person, place, and time. He appears well-developed and well-nourished. No distress.  HENT:  Head: Normocephalic and atraumatic.  Mouth/Throat: Oropharynx is clear and moist.  Eyes: Pupils are equal, round, and reactive to light. EOM are normal.  Neck: Normal range of motion. Neck supple.  Cardiovascular: Normal rate and regular rhythm. Exam reveals no friction rub.  No murmur heard. Pulmonary/Chest: Effort normal and breath sounds normal. No respiratory distress. He has no wheezes. He has no rales.  Abdominal: Soft. Bowel sounds are normal. He exhibits no distension. There is no tenderness.  Musculoskeletal: Normal range of motion. He exhibits no edema.  Neurological: He is alert and oriented to person, place, and time. A cranial nerve deficit is present. He exhibits normal muscle tone. Coordination normal.  There is a left-sided facial droop noted with involvement of the temporal/forehead area.  The left eyelid closes slower and with less force than the right.  Skin: Skin is warm and dry. He is  not diaphoretic.  Nursing note and vitals reviewed.    ED Treatments / Results  Labs (all labs ordered are listed, but only abnormal results are displayed) Labs Reviewed  CBC  BASIC METABOLIC PANEL    EKG None  Radiology No results found.  Procedures Procedures (including critical care time)  Medications Ordered in ED Medications - No data to display   Initial Impression / Assessment and Plan / ED Course  I have reviewed the triage vital signs and the nursing notes.  Pertinent labs & imaging results that were available during my care of the patient were reviewed by me and considered in my medical decision  making (see chart for details).  Patient's presentation is most consistent with a Bell's Palsy.  CT is negative and labs are unremarkable.  He will be treated with prednisone and acyclovir and is to follow-up with his primary doctor.  Final Clinical Impressions(s) / ED Diagnoses   Final diagnoses:  None    ED Discharge Orders    None       Geoffery Lyons, MD 12/21/17 0151

## 2017-12-27 ENCOUNTER — Emergency Department (HOSPITAL_COMMUNITY)
Admission: EM | Admit: 2017-12-27 | Discharge: 2017-12-27 | Disposition: A | Discharge status: Home / Self Care | Payer: Self-pay | Attending: Emergency Medicine | Admitting: Emergency Medicine

## 2017-12-27 ENCOUNTER — Encounter (HOSPITAL_COMMUNITY): Payer: Self-pay | Admitting: Emergency Medicine

## 2017-12-27 DIAGNOSIS — G510 Bell's palsy: Secondary | ICD-10-CM | POA: Insufficient documentation

## 2017-12-27 DIAGNOSIS — G43809 Other migraine, not intractable, without status migrainosus: Secondary | ICD-10-CM | POA: Insufficient documentation

## 2017-12-27 DIAGNOSIS — Z79899 Other long term (current) drug therapy: Secondary | ICD-10-CM | POA: Insufficient documentation

## 2017-12-27 DIAGNOSIS — R519 Headache, unspecified: Secondary | ICD-10-CM

## 2017-12-27 DIAGNOSIS — R51 Headache: Secondary | ICD-10-CM

## 2017-12-27 MED ORDER — NAPROXEN 500 MG PO TABS: 500.0000 mg | ORAL_TABLET | Freq: Two times a day (BID) | ORAL | 0 refills | Status: DC

## 2017-12-27 MED ORDER — VALACYCLOVIR HCL 1 G PO TABS: 1000.0000 mg | ORAL_TABLET | Freq: Three times a day (TID) | ORAL | 0 refills | Status: AC

## 2017-12-27 MED ORDER — DEXAMETHASONE SODIUM PHOSPHATE 10 MG/ML IJ SOLN
20.0000 mg | Freq: Once | INTRAMUSCULAR | Status: AC
Start: 2017-12-27 — End: 2017-12-27
  Administered 2017-12-27: 20 mg via INTRAMUSCULAR
  Filled 2017-12-27: qty 2

## 2017-12-27 MED ORDER — KETOROLAC TROMETHAMINE 60 MG/2ML IM SOLN
60.0000 mg | Freq: Once | INTRAMUSCULAR | Status: AC
  Administered 2017-12-27: 60 mg via INTRAMUSCULAR
  Filled 2017-12-27: qty 2

## 2017-12-27 NOTE — Discharge Instructions (Signed)
1.  Schedule follow-up appointment with a neurologist.  Call [facility] to have an appointment.  We will also need a family doctor.  Use the referral number to find one.

## 2017-12-27 NOTE — ED Triage Notes (Signed)
Pt reports left sided headache that began last night, pt has left sided facial droop, was diagnosed with bells palsy on 4/29. No weakness, speech clear.

## 2017-12-27 NOTE — ED Notes (Signed)
Declined W/C at D/C and was escorted to lobby by RN. 

## 2018-01-01 ENCOUNTER — Ambulatory Visit: Payer: Self-pay | Admitting: Emergency Medicine

## 2018-01-01 ENCOUNTER — Encounter: Payer: Self-pay | Admitting: Emergency Medicine

## 2018-01-01 VITALS — BP 124/82 | HR 88 | Temp 98.1°F | Resp 16 | Ht 66.0 in | Wt 179.2 lb

## 2018-01-01 DIAGNOSIS — H9202 Otalgia, left ear: Secondary | ICD-10-CM | POA: Insufficient documentation

## 2018-01-01 DIAGNOSIS — G510 Bell's palsy: Secondary | ICD-10-CM

## 2018-01-01 MED ORDER — TRAMADOL HCL 50 MG PO TABS: 50.0000 mg | ORAL_TABLET | Freq: Four times a day (QID) | ORAL | 0 refills | Status: DC | PRN

## 2018-01-01 MED ORDER — PREDNISONE 20 MG PO TABS: 40.0000 mg | ORAL_TABLET | Freq: Every day | ORAL | 0 refills | Status: AC

## 2018-01-01 NOTE — Progress Notes (Signed)
Barry Fry 52 y.o.   Chief Complaint  Patient presents with  . follow up    left side of face,pt states behind the left ear was hurting all last night, seen in [facility] ED on 12/27/17    HISTORY OF PRESENT ILLNESS: This is a 52 y.o. male complaining of pain to the left ear area that started 2 weeks ago.  Followed by paralysis of the left side of his face.  Seen in the emergency room and diagnosed with Bell's palsy.  Started on valacyclovir and Naprosyn.  Had to return a second time due to pain.  Pain got worse 2 days ago.  Paralysis slightly better.  No significant new symptomatology.  HPI   Prior to Admission medications   Medication Sig Start Date End Date Taking? Authorizing Provider  cyclobenzaprine (FLEXERIL) 5 MG tablet Take 1 tablet (5 mg total) by mouth 3 (three) times daily as needed for muscle spasms. 12/10/17  Yes Benjiman Core D, PA-C  naproxen (NAPROSYN) 500 MG tablet Take 1 tablet (500 mg total) by mouth 2 (two) times daily with a meal. 12/10/17  Yes Barnett Abu, [place] D, PA-C  naproxen (NAPROSYN) 500 MG tablet Take 1 tablet (500 mg total) by mouth 2 (two) times daily. 12/27/17  Yes Arby Barrette, MD  omeprazole (PRILOSEC) 40 MG capsule Take 1 capsule (40 mg total) by mouth daily. Patient not taking: Reported on 01/01/2018 05/30/17   Valarie Cones, Dema Severin, PA-C  valACYclovir (VALTREX) 1000 MG tablet Take 1 tablet (1,000 mg total) by mouth 3 (three) times daily. Patient not taking: Reported on 01/01/2018 12/21/17   Geoffery Lyons, MD  valACYclovir (VALTREX) 1000 MG tablet Take 1 tablet (1,000 mg total) by mouth 3 (three) times daily for 14 days. Patient not taking: Reported on 01/01/2018 12/27/17 01/10/18  Arby Barrette, MD    No Known Allergies  There are no active problems to display for this patient.   Past Medical History:  Diagnosis Date  . Allergy     Past Surgical History:  Procedure Laterality Date  . EYE SURGERY Right 10/30/2017  . EYE SURGERY Left 09/2017     Social History   Socioeconomic History  . Marital status: Married    Spouse name: Not on file  . Number of children: 2  . Years of education: Not on file  . Highest education level: Not on file  Occupational History  . Not on file  Social Needs  . Financial resource strain: Not on file  . Food insecurity:    Worry: Not on file    Inability: Not on file  . Transportation needs:    Medical: Not on file    Non-medical: Not on file  Tobacco Use  . Smoking status: Never Smoker  . Smokeless tobacco: Never Used  Substance and Sexual Activity  . Alcohol use: No  . Drug use: No  . Sexual activity: Yes    Birth control/protection: Condom  Lifestyle  . Physical activity:    Days per week: Not on file    Minutes per session: Not on file  . Stress: Not on file  Relationships  . Social connections:    Talks on phone: Not on file    Gets together: Not on file    Attends religious service: Not on file    Active member of club or organization: Not on file    Attends meetings of clubs or organizations: Not on file    Relationship status: Not on file  . Intimate  partner violence:    Fear of current or ex partner: Not on file    Emotionally abused: Not on file    Physically abused: Not on file    Forced sexual activity: Not on file  Other Topics Concern  . Not on file  Social History Narrative  . Not on file    No family history on file.   Review of Systems  Constitutional: Negative for chills and fever.  HENT: Positive for ear pain (left).   Eyes: Negative.  Negative for blurred vision and double vision.  Respiratory: Negative.  Negative for cough and shortness of breath.   Cardiovascular: Negative.  Negative for chest pain and leg swelling.  Gastrointestinal: Negative.  Negative for nausea and vomiting.  Genitourinary: Negative.  Negative for dysuria and hematuria.  Musculoskeletal: Negative.  Negative for back pain, myalgias and neck pain.  Skin: Negative.  Negative  for rash.  Neurological: Negative.  Negative for dizziness and headaches.  Endo/Heme/Allergies: Negative.   All other systems reviewed and are negative.   Vitals:   01/01/18 1222  BP: 124/82  Pulse: 88  Resp: 16  Temp: 98.1 F (36.7 C)  SpO2: 97%    Physical Exam  Constitutional: He is oriented to person, place, and time. He appears well-developed and well-nourished.  HENT:  Head: Normocephalic and atraumatic.  Right Ear: Tympanic membrane, external ear and ear canal normal.  Left Ear: Tympanic membrane, external ear and ear canal normal.  Mouth/Throat: Oropharynx is clear and moist.  Eyes: Pupils are equal, round, and reactive to light. Conjunctivae and EOM are normal.  Neck: Normal range of motion. Neck supple.  Cardiovascular: Normal rate, regular rhythm and normal heart sounds.  Pulmonary/Chest: Effort normal and breath sounds normal.  Musculoskeletal: Normal range of motion.  Lymphadenopathy:    He has no cervical adenopathy.  Neurological: He is alert and oriented to person, place, and time. He displays normal reflexes. A cranial nerve deficit (left VII n. palsy LMN type) is present. No sensory deficit. He exhibits normal muscle tone. Coordination normal.  Skin: Skin is warm and dry. Capillary refill takes less than 2 seconds. No rash noted.  Psychiatric: He has a normal mood and affect. His behavior is normal.  Vitals reviewed.    ASSESSMENT & PLAN: [name] was seen today for follow up.  Diagnoses and all orders for this visit:  Bell's palsy -     predniSONE (DELTASONE) 20 MG tablet; Take 2 tablets (40 mg total) by mouth daily with breakfast for 5 days.  Otalgia of left ear -     predniSONE (DELTASONE) 20 MG tablet; Take 2 tablets (40 mg total) by mouth daily with breakfast for 5 days. -     traMADol (ULTRAM) 50 MG tablet; Take 1 tablet (50 mg total) by mouth every 6 (six) hours as needed.   Patient Instructions       IF you received an x-ray today, you  will receive an invoice from [facility]. Please contact [facility] at [phone] with questions or concerns regarding your invoice.   IF you received labwork today, you will receive an invoice from [place]. Please contact [facility] at [phone] with questions or concerns regarding your invoice.   Our billing staff will not be able to assist you with questions regarding bills from these companies.  You will be contacted with the lab results as soon as they are available. The fastest way to get your results is to activate your My Chart account.  Instructions are located on the last page of this paperwork. If you have not heard from [place] regarding the results in 2 weeks, please contact this office.    Parlisis facial (Bell Palsy) La parlisis facial es una afeccin en la que se paralizan los msculos de un lado de la cara. Esto a menudo provoca la cada de un lado de la cara. Es una afeccin frecuente y Games developer de las personas se recuperan por completo. FACTORES DE RIESGO Los factores de riesgo de la parlisis facial incluyen:  Psychiatrist.  Diabetes.  Una infeccin provocada por un virus, como las infecciones que causan llagas peribucales. CAUSAS  La parlisis facial es ocasionada por un dao o una inflamacin de un nervio de la cara. No est claro por qu sucede, pero puede ocurrir a causa de una infeccin provocada por un virus. La mayora de las veces se desconoce la causa. Blake Divine SNTOMAS  Los sntomas pueden variar de leves a graves y pueden durar varias horas. Entre los sntomas se pueden incluir los siguientes:  No poder Education officer, environmental lo siguiente: ? Surveyor, mining o las dos cejas. ? [employer]. ? Sentir partes de la cara (entumecimiento facial).  Cada del prpado y la comisura de la boca.  Debilidad en la cara.  Parlisis de la mitad de la cara.  Prdida del gusto.  Sensibilidad a los ruidos fuertes.  Dificultad para  Product manager.  Lagrimeo del ojo afectado.  Sequedad del ojo afectado.  Babeo.  Dolor detrs de [employer]. DIAGNSTICO  El diagnstico de la parlisis facial puede incluir:  Un examen fsico y Neomia Dear historia clnica.  Una resonancia magntica.  Una tomografa computarizada.  Electromiograma (EMG). Esta prueba se realiza para controlar el funcionamiento de los nervios. TRATAMIENTO  El tratamiento puede incluir medicamentos antivirales para ayudar a reducir la duracin de Astronomer. En ocasiones, el tratamiento no es necesario y los sntomas desaparecen por s solos. INSTRUCCIONES PARA EL CUIDADO EN EL HOGAR   Tome los medicamentos solamente como se lo haya indicado el mdico.  Hgase masajes y realice los ejercicios faciales, como se lo haya indicado el mdico.  Si el ojo est afectado: ? Use gotas oftlmicas con efecto hidratante para prevenir la sequedad del ojo, como se lo haya indicado el mdico. ? Proteja el ojo como se lo haya indicado el mdico. SOLICITE ATENCIN MDICA SI:  Los sntomas no mejoran o empeoran.  Babea.  El ojo est rojo, irritado o le duele. SOLICITE ATENCIN MDICA DE INMEDIATO SI:   Siente otra parte del cuerpo dbil o adormecida.  Tiene dificultad para tragar.  Tiene fiebre adems de los sntomas de la parlisis facial.  Siente dolor en el cuello. ASEGRESE DE QUE:   Comprende estas instrucciones.  Controlar su afeccin.  Recibir ayuda de inmediato si no mejora o si empeora. Esta informacin no tiene Theme park manager el consejo del mdico. Asegrese de hacerle al mdico cualquier pregunta que tenga. Document Released: 08/11/2005 Document Revised: 05/02/2015 Elsevier Interactive Patient Education  2017 Elsevier Inc.      Edwina Barth, MD [facility]

## 2018-01-01 NOTE — Patient Instructions (Addendum)
IF you received an x-ray today, you will receive an invoice from [facility]. Please contact [facility] at [phone] with questions or concerns regarding your invoice.   IF you received labwork today, you will receive an invoice from [place]. Please contact [facility] at [phone] with questions or concerns regarding your invoice.   Our billing staff will not be able to assist you with questions regarding bills from these companies.  You will be contacted with the lab results as soon as they are available. The fastest way to get your results is to activate your My Chart account. Instructions are located on the last page of this paperwork. If you have not heard from [place] regarding the results in 2 weeks, please contact this office.    Parlisis facial (Bell Palsy) La parlisis facial es una afeccin en la que se paralizan los msculos de un lado de la cara. Esto a menudo provoca la cada de un lado de la cara. Es una afeccin frecuente y Games developer de las personas se recuperan por completo. FACTORES DE RIESGO Los factores de riesgo de la parlisis facial incluyen:  Psychiatrist.  Diabetes.  Una infeccin provocada por un virus, como las infecciones que causan llagas peribucales. CAUSAS  La parlisis facial es ocasionada por un dao o una inflamacin de un nervio de la cara. No est claro por qu sucede, pero puede ocurrir a causa de una infeccin provocada por un virus. La mayora de las veces se desconoce la causa. Blake Divine SNTOMAS  Los sntomas pueden variar de leves a graves y pueden durar varias horas. Entre los sntomas se pueden incluir los siguientes:  No poder Education officer, environmental lo siguiente: ? Surveyor, mining o las dos cejas. ? [employer]. ? Sentir partes de la cara (entumecimiento facial).  Cada del prpado y la comisura de la boca.  Debilidad en la cara.  Parlisis de la mitad de la cara.  Prdida del gusto.  Sensibilidad a los ruidos  fuertes.  Dificultad para Product manager.  Lagrimeo del ojo afectado.  Sequedad del ojo afectado.  Babeo.  Dolor detrs de [employer]. DIAGNSTICO  El diagnstico de la parlisis facial puede incluir:  Un examen fsico y Neomia Dear historia clnica.  Una resonancia magntica.  Una tomografa computarizada.  Electromiograma (EMG). Esta prueba se realiza para controlar el funcionamiento de los nervios. TRATAMIENTO  El tratamiento puede incluir medicamentos antivirales para ayudar a reducir la duracin de Astronomer. En ocasiones, el tratamiento no es necesario y los sntomas desaparecen por s solos. INSTRUCCIONES PARA EL CUIDADO EN EL HOGAR   Tome los medicamentos solamente como se lo haya indicado el mdico.  Hgase masajes y realice los ejercicios faciales, como se lo haya indicado el mdico.  Si el ojo est afectado: ? Use gotas oftlmicas con efecto hidratante para prevenir la sequedad del ojo, como se lo haya indicado el mdico. ? Proteja el ojo como se lo haya indicado el mdico. SOLICITE ATENCIN MDICA SI:  Los sntomas no mejoran o empeoran.  Babea.  El ojo est rojo, irritado o le duele. SOLICITE ATENCIN MDICA DE INMEDIATO SI:   Siente otra parte del cuerpo dbil o adormecida.  Tiene dificultad para tragar.  Tiene fiebre adems de los sntomas de la parlisis facial.  Siente dolor en el cuello. ASEGRESE DE QUE:   Comprende estas instrucciones.  Controlar su afeccin.  Recibir ayuda de inmediato si no mejora o si empeora. Esta informacin no tiene como fin  reemplazar el consejo del mdico. Asegrese de hacerle al mdico cualquier pregunta que tenga. Document Released: 08/11/2005 Document Revised: 05/02/2015 Elsevier Interactive Patient Education  2017 [employer].

## 2018-01-07 NOTE — ED Provider Notes (Signed)
[facility] EMERGENCY DEPARTMENT Provider Note   CSN: [id number] Arrival date & time: 12/27/17  1414     History   Chief Complaint Chief Complaint  Patient presents with  . Headache    HPI Barry Fry is a 52 y.o. male.  HPI Patient was diagnosed with Bell's palsy [phone].  He has been taking his prednisone and acyclovir.  He reports he has had more of a headache on the left side of his head over the past few days.  He reports the symptoms did not start with a severe headache.  This is developed more gradually.  He is not taking anything additional for pain.  He has been compliantly taking the prednisone and acyclovir.  There is been no real change in the degree of facial droop.  No rashes developed.  No visual problems or hearing problems.  Does continue have pain in the left ear.  Patient has not developed any fever.  No incoordination, no visual change, no weakness numbness or tingling of extremities.  Patient son acts as Nurse, learning disability. Past Medical History:  Diagnosis Date  . Allergy     Patient Active Problem List   Diagnosis Date Noted  . Bell's palsy 01/01/2018  . Otalgia of left ear 01/01/2018    Past Surgical History:  Procedure Laterality Date  . EYE SURGERY Right 10/30/2017  . EYE SURGERY Left 09/2017        Home Medications    Prior to Admission medications   Medication Sig Start Date End Date Taking? Authorizing Provider  cyclobenzaprine (FLEXERIL) 5 MG tablet Take 1 tablet (5 mg total) by mouth 3 (three) times daily as needed for muscle spasms. 12/10/17   Benjiman Core D, PA-C  naproxen (NAPROSYN) 500 MG tablet Take 1 tablet (500 mg total) by mouth 2 (two) times daily with a meal. 12/10/17   Barnett Abu, [place] D, PA-C  naproxen (NAPROSYN) 500 MG tablet Take 1 tablet (500 mg total) by mouth 2 (two) times daily. 12/27/17   Arby Barrette, MD  omeprazole (PRILOSEC) 40 MG capsule Take 1 capsule (40 mg total) by mouth  daily. Patient not taking: Reported on 01/01/2018 05/30/17   Morrell Riddle, PA-C  traMADol (ULTRAM) 50 MG tablet Take 1 tablet (50 mg total) by mouth every 6 (six) hours as needed. 01/01/18   Georgina Quint, MD  valACYclovir (VALTREX) 1000 MG tablet Take 1 tablet (1,000 mg total) by mouth 3 (three) times daily. Patient not taking: Reported on 01/01/2018 12/21/17   Geoffery Lyons, MD  valACYclovir (VALTREX) 1000 MG tablet Take 1 tablet (1,000 mg total) by mouth 3 (three) times daily for 14 days. Patient not taking: Reported on 01/01/2018 12/27/17 01/10/18  Arby Barrette, MD    Family History No family history on file.  Social History Social History   Tobacco Use  . Smoking status: Never Smoker  . Smokeless tobacco: Never Used  Substance Use Topics  . Alcohol use: No  . Drug use: No     Allergies   Patient has no known allergies.   Review of Systems Review of Systems 10 Systems reviewed and are negative for acute change except as noted in the HPI.   Physical Exam Updated Vital Signs BP (!) 138/96 (BP Location: Right Arm)   Pulse 72   Temp 99 F (37.2 C) (Oral)   Resp 17   SpO2 97%   Physical Exam  Constitutional: He is oriented to person, place, and time.  Awake, alert, nontoxic  appearance with baseline speech for patient.  HENT:  Head: Atraumatic.  Mouth/Throat: No oropharyngeal exudate.  Patient has left-sided facial droop.  This includes lack of movement of the forehead.  TMs are normal.  There is no lesions in the ear canal.  No facial lesions.  Some reproducible tenderness to palpation of the paraspinous muscle bodies in the occiput on the left.  No palpable or visible soft tissue anomalies.  Neck is supple.  Eyes: Pupils are equal, round, and reactive to light. EOM are normal. Right eye exhibits no discharge. Left eye exhibits no discharge.  Neck: Normal range of motion. Neck supple. No thyromegaly present.  Patient does endorse some discomfort to palpation at  the occiput on the left.  No rashes or palpable soft tissue anomalies.  No meningismus.  Cardiovascular: Normal rate and regular rhythm.  No murmur heard. Pulmonary/Chest: Effort normal and breath sounds normal. No stridor. No respiratory distress. He has no wheezes. He has no rales. He exhibits no tenderness.  Abdominal: Soft. Bowel sounds are normal. He exhibits no mass. There is no tenderness. There is no rebound.  Musculoskeletal: He exhibits no tenderness.  Baseline ROM, moves extremities with no obvious new focal weakness.  Lymphadenopathy:    He has no cervical adenopathy.  Neurological: He is alert and oriented to person, place, and time. A cranial nerve deficit is present.  Awake, alert, cooperative and aware of situation; motor strength bilaterally; sensation normal to light touch bilaterally; peripheral visual fields full.  Patient does have facial droop that is pronounced on the left side.  This also includes lack of movement of the forehead musculature.  Skin: Skin is warm and dry. No rash noted.  Psychiatric: He has a normal mood and affect.  Nursing note and vitals reviewed.    ED Treatments / Results  Labs (all labs ordered are listed, but only abnormal results are displayed) Labs Reviewed - No data to display  EKG None  Radiology No results found.  Procedures Procedures (including critical care time)  Medications Ordered in ED Medications  ketorolac (TORADOL) injection 60 mg (60 mg Intramuscular Given 12/27/17 1916)  dexamethasone (DECADRON) injection 20 mg (20 mg Intramuscular Given 12/27/17 1915)     Initial Impression / Assessment and Plan / ED Course  I have reviewed the triage vital signs and the nursing notes.  Pertinent labs & imaging results that were available during my care of the patient were reviewed by me and considered in my medical decision making (see chart for details).      Final Clinical Impressions(s) / ED Diagnoses   Final diagnoses:   Acute nonintractable headache, unspecified headache type  Bell's palsy  Patient is clinically well in appearance.  He is not developed fever.  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  Patient shows no signs of being encephalopathic.  There are no lesions on the face, and the ear canal, the nose or eye.  Findings still very consistent with Bell's palsy.  There is not been any significant improvement in the degree of facial weakness.  Patient was given Toradol and Decadron for headache.  Will extend his Valtrex prescription and counseled on follow-up with neurology.  Patient is aware of possible need for PT or other treatments is the Bell's palsy is not showing any significant improvement at this time.  ED Discharge Orders        Ordered    naproxen (NAPROSYN) 500 MG tablet  2 times daily     12/27/17 1942    valACYclovir (VALTREX) 1000 MG tablet  3 times daily     12/27/17 1942       Arby Barrette, MD 01/07/18 1039

## 2018-04-22 IMAGING — DX DG RIBS W/ CHEST 3+V BILAT
7 series · 7 of 7 positions shown · non-contrast
Comparison: None.

CLINICAL DATA: Canale fell on in.  Chest pain.

EXAM:
BILATERAL RIBS AND CHEST - 4+ VIEW

[chest pa]
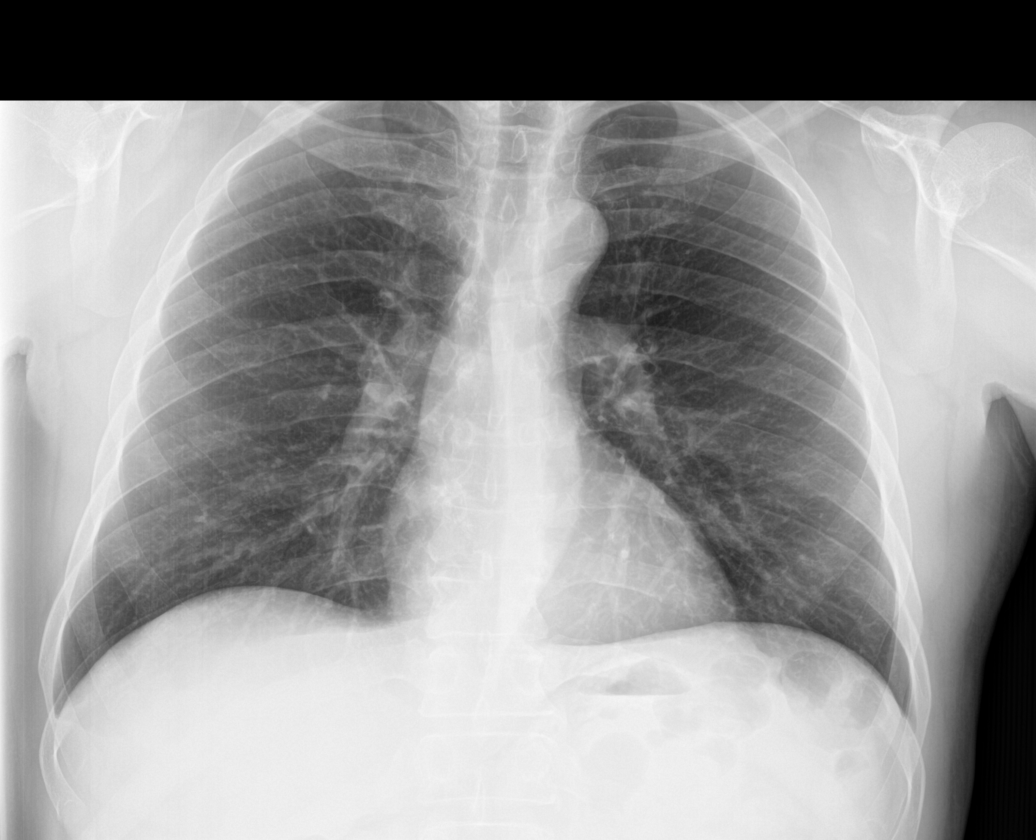

[rib ap (1 of 2)]
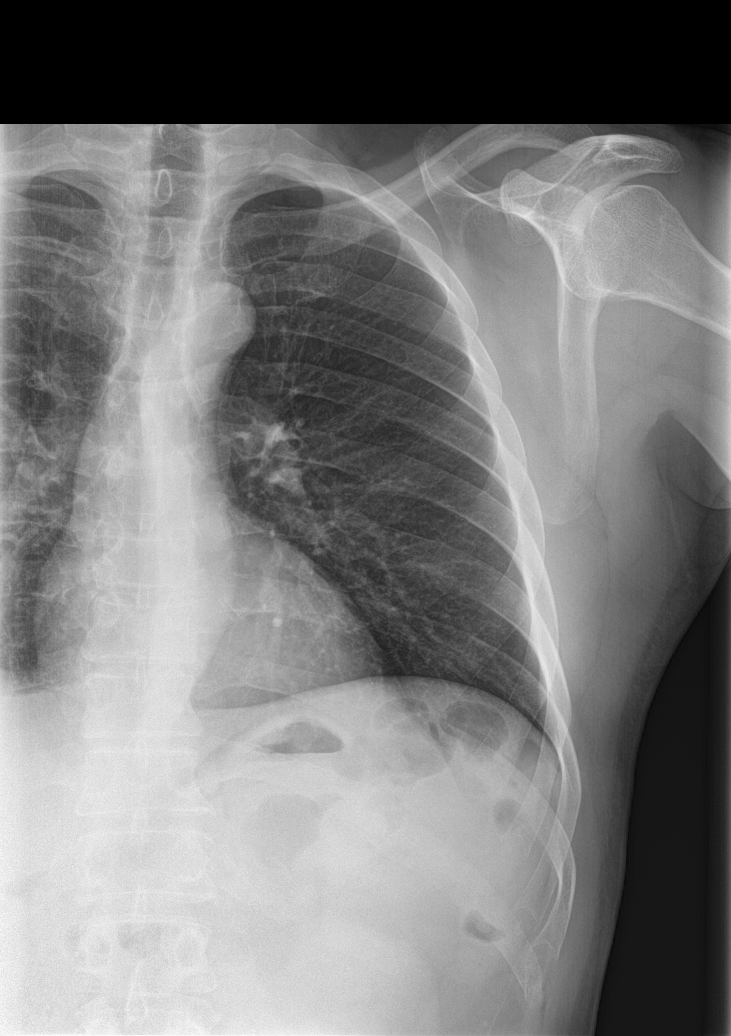

[rib ap (2 of 2)]
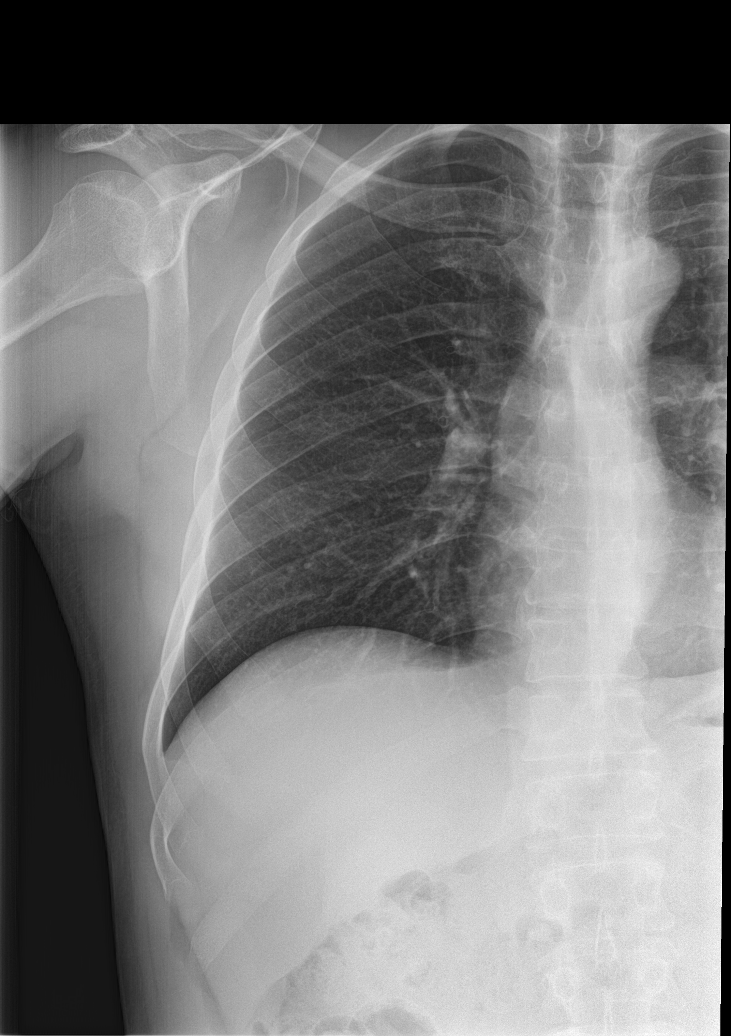

[rib obl (1 of 4)]
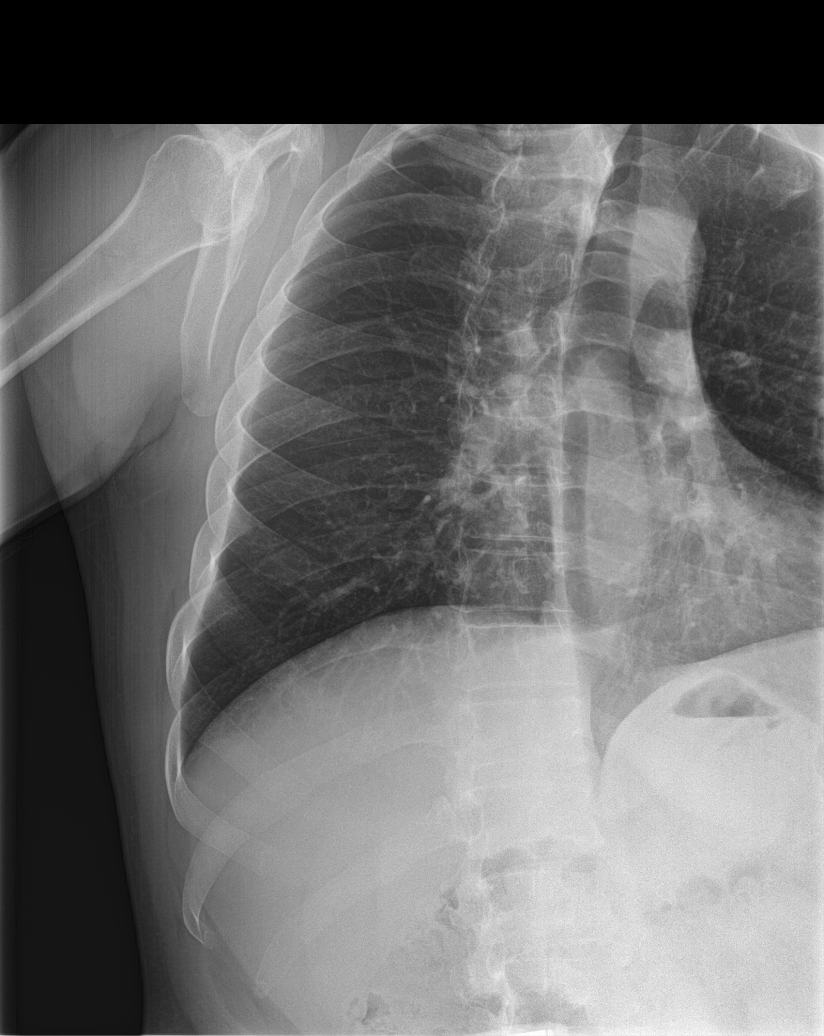

[rib obl (2 of 4)]
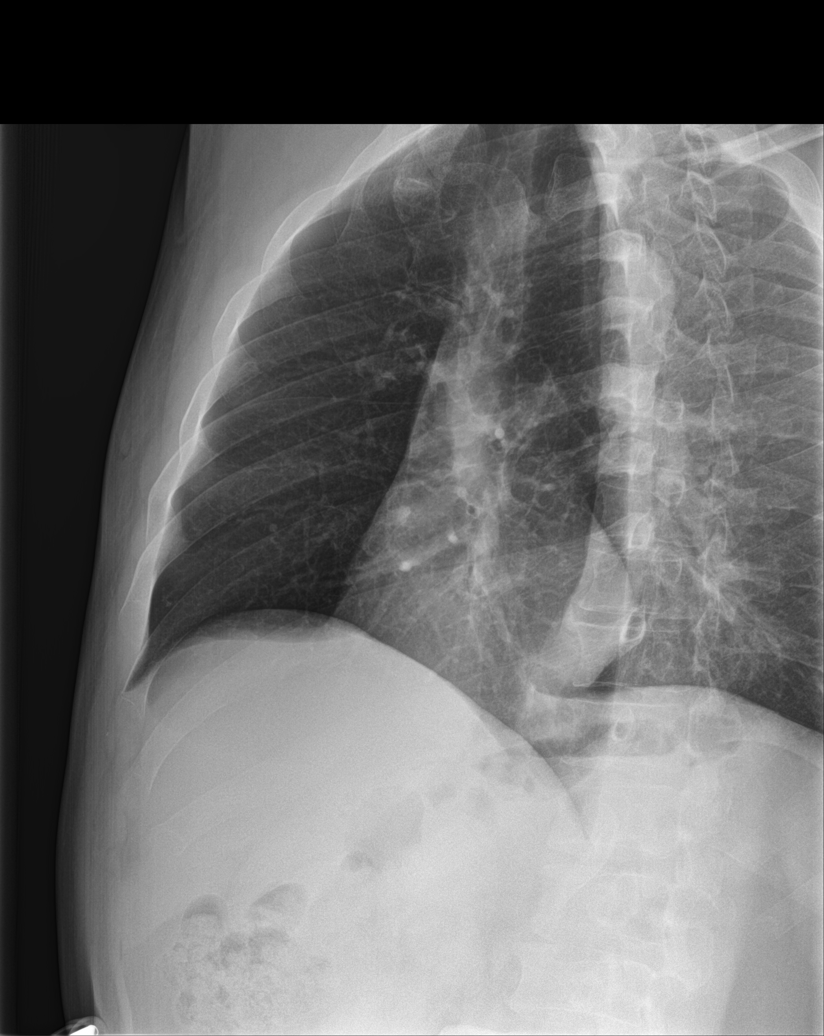

[rib obl (3 of 4)]
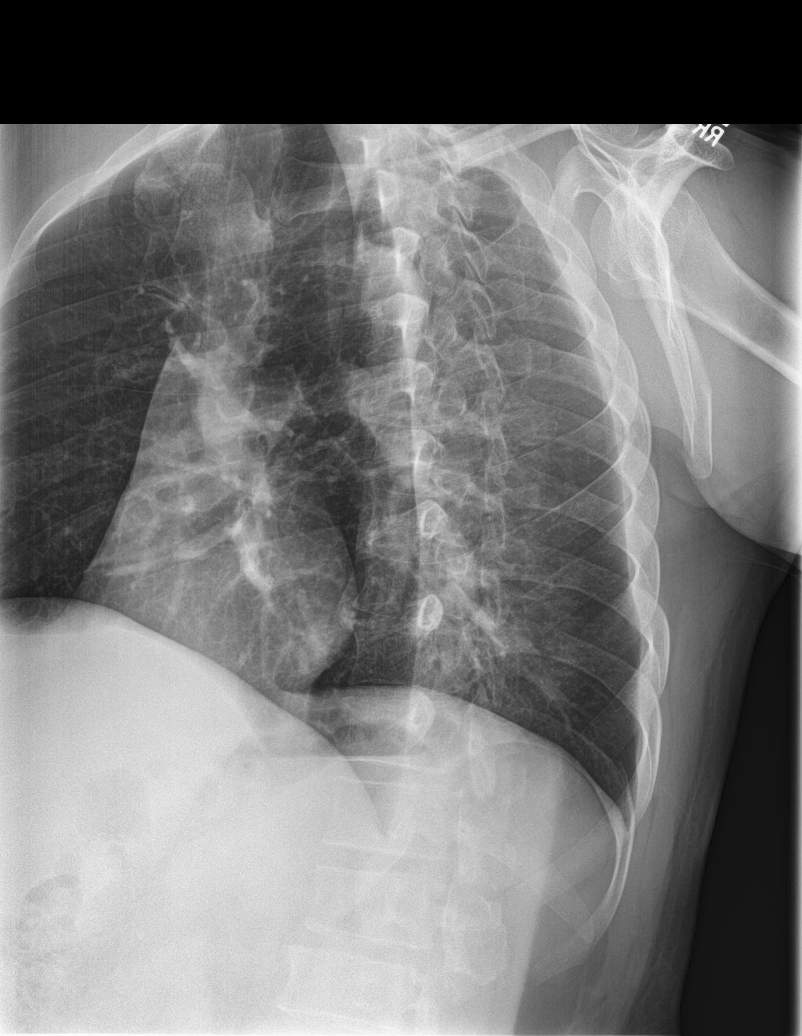

[rib obl (4 of 4)]
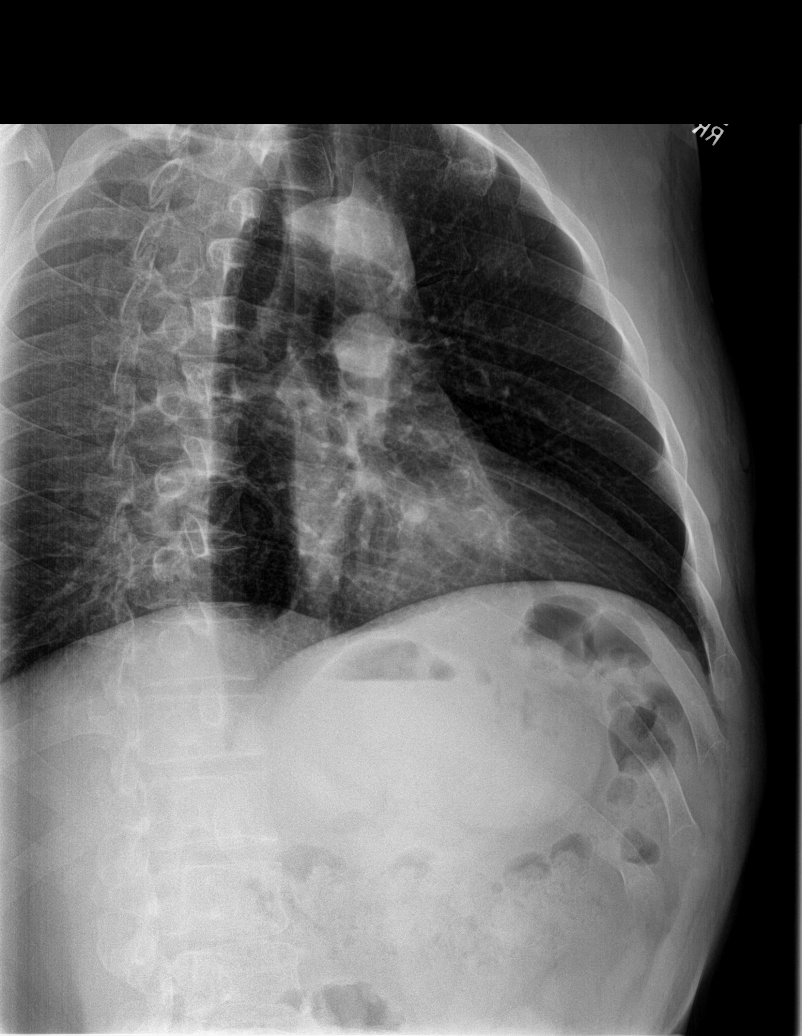

[7 of 7 positions shown; findings below may reference images not displayed]

FINDINGS: No fracture or other bone lesions are seen involving the ribs. There
is no evidence of pneumothorax or pleural effusion. Both lungs are
clear. Heart size and mediastinal contours are within normal limits.
IMPRESSION: Negative.

## 2020-05-23 ENCOUNTER — Encounter: Payer: Self-pay | Admitting: Gastroenterology

## 2020-07-17 ENCOUNTER — Encounter: Payer: Self-pay | Admitting: Gastroenterology

## 2022-05-22 NOTE — Progress Notes (Unsigned)
Cardiology Office Note:    Date:  05/22/2022   ID:  Barry Fry, DOB [birth year], MRN [id number]  PCP:  Patient, No Pcp Per   [place] Providers Cardiologist:  None {   Referring MD: Stana Bunting, PA-C    History of Present Illness:    Barry Fry is a 56 y.o. male with a hx of GERD, HTN, and HLD who was referred by Stana Bunting for further evaluation of left sided chest pain.  Was seen on 04/16/22 by Stana Bunting, PA-C where he complained of intermittent left sided chest pain. Note reviewed. Left sided chest pain occurred usually after meals and improved with belching. Given risk factors, he was referred to Cardiology for further evaluation.  Today, ***   Past Medical History:  Diagnosis Date   Allergy    Chest pain    COVID-19    Dyslipidemia    GERD (gastroesophageal reflux disease)    Helicobacter pylori gastrointestinal tract infection    HTN (hypertension)    Left-sided chest pain    SOB (shortness of breath)     Past Surgical History:  Procedure Laterality Date   EYE SURGERY Right 10/30/2017   EYE SURGERY Left 09/2017    Current Medications: No outpatient medications have been marked as taking for the 05/28/22 encounter (Appointment) with Freada Bergeron, MD.     Allergies:   Patient has no known allergies.   Social History   Socioeconomic History   Marital status: Married    Spouse name: Not on file   Number of children: 2   Years of education: Not on file   Highest education level: Not on file  Occupational History   Not on file  Tobacco Use   Smoking status: Never   Smokeless tobacco: Never  Vaping Use   Vaping Use: Never used  Substance and Sexual Activity   Alcohol use: No   Drug use: No   Sexual activity: Yes    Birth control/protection: Condom  Other Topics Concern   Not on file  Social History Narrative   Not on file   Social Determinants of Health   Financial Resource Strain: Not  on file  Food Insecurity: Not on file  Transportation Needs: Not on file  Physical Activity: Not on file  Stress: Not on file  Social Connections: Not on file     Family History: The patient's ***family history is not on file.  ROS:   Please see the history of present illness.    *** All other systems reviewed and are negative.  EKGs/Labs/Other Studies Reviewed:    The following studies were reviewed today: ***  EKG:  EKG is *** ordered today.  The ekg ordered today demonstrates ***  Recent Labs: No results found for requested labs within last 365 days.  Recent Lipid Panel No results found for: "CHOL", "TRIG", "HDL", "CHOLHDL", "VLDL", "LDLCALC", "LDLDIRECT"   Risk Assessment/Calculations:   {Does this patient have ATRIAL FIBRILLATION?[phone]}  No BP recorded.  {Refresh Note OR Click here to enter BP  :1}***         Physical Exam:    VS:  There were no vitals taken for this visit.    Wt Readings from Last 3 Encounters:  01/01/18 179 lb 3.2 oz (81.3 kg)  12/21/17 178 lb (80.7 kg)  12/10/17 176 lb 12.8 oz (80.2 kg)     GEN: *** Well nourished, well developed in no acute distress HEENT: Normal NECK: No JVD; No carotid bruits  LYMPHATICS: No lymphadenopathy CARDIAC: ***RRR, no murmurs, rubs, gallops RESPIRATORY:  Clear to auscultation without rales, wheezing or rhonchi  ABDOMEN: Soft, non-tender, non-distended MUSCULOSKELETAL:  No edema; No deformity  SKIN: Warm and dry NEUROLOGIC:  Alert and oriented x 3 PSYCHIATRIC:  Normal affect   ASSESSMENT:    No diagnosis found. PLAN:    In order of problems listed above:  #Chest Pain: Pain is noncardiac in nature as is not exertional and improves with belching and drinking milk. Agree with PCP that this is likely related to GERD and can trial OTF antiacids to help or change diet to decrease spicy/greasy foods.   #HTN: -Continue lisinopril 20mg  daily  #HLD: -Continue lipitor 40mg  daily -Will check Ca score  for risk stratification           Medication Adjustments/Labs and Tests Ordered: Current medicines are reviewed at length with the patient today.  Concerns regarding medicines are outlined above.  No orders of the defined types were placed in this encounter.  No orders of the defined types were placed in this encounter.   There are no Patient Instructions on file for this visit.   Signed, , MD  05/22/2022 2:08 PM    [facility]

## 2022-05-28 ENCOUNTER — Encounter: Payer: Self-pay | Admitting: Cardiology

## 2022-05-28 ENCOUNTER — Ambulatory Visit: Payer: Self-pay | Attending: Cardiology | Admitting: Cardiology

## 2022-05-28 VITALS — BP 120/84 | HR 62 | Ht 62.0 in | Wt 183.2 lb

## 2022-05-28 DIAGNOSIS — R0789 Other chest pain: Secondary | ICD-10-CM

## 2022-05-28 DIAGNOSIS — I10 Essential (primary) hypertension: Secondary | ICD-10-CM

## 2022-05-28 DIAGNOSIS — E785 Hyperlipidemia, unspecified: Secondary | ICD-10-CM

## 2022-05-28 NOTE — Progress Notes (Signed)
Cardiology Office Note:    Date:  05/28/2022   ID:  Barry Fry, DOB 1965/11/04, MRN [id number]  PCP:  Stana Bunting, [place] Providers Cardiologist:  None {   Referring MD: Stana Bunting, PA-C    History of Present Illness:    Barry Fry is a 56 y.o. male with a hx of GERD, HTN, and HLD who was referred by Stana Bunting for further evaluation of left sided chest pain.  Was seen on 04/16/22 by Stana Bunting, PA-C where he complained of intermittent left sided chest pain. Note reviewed. Left sided chest pain occurred usually after meals and improved with belching. Given risk factors, he was referred to Cardiology for further evaluation.  Today, he is accompanied by a Psychologist, sport and exercise.   He is experiencing minor chest pain, that feels more like a stabbing pain. It occurs about twice a week.  He feels the chest pain more often at night. Drinking milk seems to relieve some of his pain. He denies having any chest pain with exercise. He works in [employer], and is able to do his job. He doesn't feel any cardiac symptoms while working.  Additionally he complains of headaches and occasional shortness of breath.  He denies any family history of cardiac issues. He denies smoking.   He denies any palpitations, chest pain, or peripheral edema. No lightheadedness, syncope, orthopnea, or PND.    Past Medical History:  Diagnosis Date   Allergy    Chest pain    COVID-19    Dyslipidemia    GERD (gastroesophageal reflux disease)    Helicobacter pylori gastrointestinal tract infection    HTN (hypertension)    Left-sided chest pain    SOB (shortness of breath)     Past Surgical History:  Procedure Laterality Date   EYE SURGERY Right 10/30/2017   EYE SURGERY Left 09/2017    Current Medications: Current Meds  Medication Sig   atorvastatin (LIPITOR) 40 MG tablet Take 40 mg by mouth daily.   clarithromycin (BIAXIN XL) 500 MG  24 hr tablet Take 1,000 mg by mouth daily.   lisinopril (ZESTRIL) 20 MG tablet Take 10 mg by mouth daily.   omeprazole (PRILOSEC) 40 MG capsule Take 1 capsule (40 mg total) by mouth daily.     Allergies:   Patient has no known allergies.   Social History   Socioeconomic History   Marital status: Married    Spouse name: Not on file   Number of children: 2   Years of education: Not on file   Highest education level: Not on file  Occupational History   Not on file  Tobacco Use   Smoking status: Never   Smokeless tobacco: Never  Vaping Use   Vaping Use: Never used  Substance and Sexual Activity   Alcohol use: No   Drug use: No   Sexual activity: Yes    Birth control/protection: Condom  Other Topics Concern   Not on file  Social History Narrative   Not on file   Social Determinants of Health   Financial Resource Strain: Not on file  Food Insecurity: Not on file  Transportation Needs: Not on file  Physical Activity: Not on file  Stress: Not on file  Social Connections: Not on file     Family History: The patient's family history is not on file.  ROS:   Review of Systems  Constitutional:  Negative for chills and fever.  HENT:  Negative for ear pain and hearing  loss.   Eyes:  Negative for blurred vision and pain.  Respiratory:  Positive for shortness of breath. Negative for cough.   Cardiovascular:  Positive for chest pain. Negative for palpitations, orthopnea, claudication, leg swelling and PND.  Gastrointestinal:  Negative for nausea and vomiting.  Genitourinary:  Negative for dysuria and hematuria.  Musculoskeletal:  Negative for joint pain and myalgias.  Neurological:  Positive for headaches. Negative for dizziness and sensory change.  Psychiatric/Behavioral:  Negative for depression and hallucinations.      EKGs/Labs/Other Studies Reviewed:    The following studies were reviewed today:  No prior cardiovascular studies available.  EKG:  EKG is personally  reviewed. 05/28/2022: Normal sinus rhythm. Rate 62 bpm.  Recent Labs: No results found for requested labs within last 365 days.  Recent Lipid Panel No results found for: "CHOL", "TRIG", "HDL", "CHOLHDL", "VLDL", "LDLCALC", "LDLDIRECT"   Risk Assessment/Calculations:                Physical Exam:    VS:  BP 120/84   Pulse 62   Ht 5\' 2"  (1.575 m)   Wt 183 lb 3.2 oz (83.1 kg)   SpO2 93%   BMI 33.51 kg/m     Wt Readings from Last 3 Encounters:  05/28/22 183 lb 3.2 oz (83.1 kg)  01/01/18 179 lb 3.2 oz (81.3 kg)  12/21/17 178 lb (80.7 kg)     GEN: Well nourished, well developed in no acute distress HEENT: Normal NECK: No JVD; No carotid bruits LYMPHATICS: No lymphadenopathy CARDIAC: RRR, 1/6 murmur, No rubs, No gallops RESPIRATORY:  Clear to auscultation without rales, wheezing or rhonchi  ABDOMEN: Soft, non-tender, non-distended MUSCULOSKELETAL:  No edema; No deformity  SKIN: Warm and dry NEUROLOGIC:  Alert and oriented x 3 PSYCHIATRIC:  Normal affect   ASSESSMENT:    1. Atypical chest pain   2. Hyperlipidemia, unspecified hyperlipidemia type   3. Primary hypertension    PLAN:    In order of problems listed above:  #Chest Pain: Pain is noncardiac in nature as is not exertional and improves with belching and drinking milk. Agree with PCP that this is likely related to GERD and can trial OTF antiacids to help or change diet to decrease spicy/greasy foods.   #HTN: Well controlled and at goal <130/90. -Continue lisinopril 20mg  daily  #HLD: -Continue lipitor 40mg  daily -Will check Ca score for risk stratification      Follow Up: PRN.  Medication Adjustments/Labs and Tests Ordered: Current medicines are reviewed at length with the patient today.  Concerns regarding medicines are outlined above.   Orders Placed This Encounter  Procedures   CT CARDIAC SCORING (SELF PAY ONLY)   EKG 12-Lead   No orders of the defined types were placed in this  encounter.  Patient Instructions  Medication Instructions:   Your physician recommends that you continue on your current medications as directed. Please refer to the Current Medication list given to you today.  *If you need a refill on your cardiac medications before your next appointment, please call your pharmacy*    Testing/Procedures:  CARDIAC CALCIUM SCORE(SELF PAY)   Follow-Up:  AS NEEDED WITH DR. 12/23/17    Important Information About Sugar          I,Mathew Stumpf,acting as a scribe for , MD.,have documented all relevant documentation on the behalf of , MD,as directed by  Shari Prows, MD while in the presence of Meriam Sprague, MD.  I,  Meriam Sprague, MD, have reviewed all documentation for this visit. The documentation on 05/28/22 for the exam, diagnosis, procedures, and orders are all accurate and complete.   Signed, Meriam Sprague, MD  05/28/2022 4:56 PM    [facility]

## 2022-05-28 NOTE — Patient Instructions (Signed)
Medication Instructions:   Your physician recommends that you continue on your current medications as directed. Please refer to the Current Medication list given to you today.  *If you need a refill on your cardiac medications before your next appointment, please call your pharmacy*    Testing/Procedures:  CARDIAC CALCIUM SCORE(SELF PAY)   Follow-Up:  AS NEEDED WITH DR. Johney Frame    Important Information About Sugar

## 2022-05-29 ENCOUNTER — Ambulatory Visit (HOSPITAL_BASED_OUTPATIENT_CLINIC_OR_DEPARTMENT_OTHER)
Admission: RE | Admit: 2022-05-29 | Discharge: 2022-05-29 | Disposition: A | Discharge status: Home / Self Care | Payer: Self-pay | Source: Ambulatory Visit | Attending: Cardiology | Admitting: Cardiology

## 2022-05-29 DIAGNOSIS — E785 Hyperlipidemia, unspecified: Secondary | ICD-10-CM | POA: Insufficient documentation

## 2024-03-28 ENCOUNTER — Emergency Department (HOSPITAL_COMMUNITY)
Admission: EM | Admit: 2024-03-28 | Discharge: 2024-03-29 | Disposition: A | Discharge status: Home / Self Care | Payer: Self-pay | Attending: Emergency Medicine | Admitting: Emergency Medicine

## 2024-03-28 ENCOUNTER — Encounter (HOSPITAL_COMMUNITY): Payer: Self-pay | Admitting: Emergency Medicine

## 2024-03-28 DIAGNOSIS — T5491XA Toxic effect of unspecified corrosive substance, accidental (unintentional), initial encounter: Secondary | ICD-10-CM | POA: Insufficient documentation

## 2024-03-28 DIAGNOSIS — T6591XA Toxic effect of unspecified substance, accidental (unintentional), initial encounter: Secondary | ICD-10-CM

## 2024-03-28 NOTE — ED Triage Notes (Signed)
 Stainless steel cleaner and polish in water bottle in food truck. Pt mistook it for drink and drink. Maybe drank 10 ml. Then drank milk. Feeling dizzy.

## 2024-03-28 NOTE — ED Notes (Signed)
 This RN on phone with poison control

## 2024-03-28 NOTE — ED Notes (Signed)
 Poison control states that with the cleaner, pt will experience GI upset, nausea, vomiting, fatigue. This will last 2-3 hours. No other labs or testing needed.

## 2024-03-29 LAB — I-STAT CHEM 8, ED
BUN: 29 mg/dL — ABNORMAL HIGH (ref 6–20)
Calcium, Ion: 1.12 mmol/L — ABNORMAL LOW (ref 1.15–1.40)
Chloride: 103 mmol/L (ref 98–111)
Creatinine, Ser: 0.8 mg/dL (ref 0.61–1.24)
Glucose, Bld: 152 mg/dL — ABNORMAL HIGH (ref 70–99)
HCT: 45 % (ref 39.0–52.0)
Hemoglobin: 15.3 g/dL (ref 13.0–17.0)
Potassium: 4.4 mmol/L (ref 3.5–5.1)
Sodium: 138 mmol/L (ref 135–145)
TCO2: 26 mmol/L (ref 22–32)

## 2024-03-29 MED ORDER — ACETAMINOPHEN 325 MG PO TABS
ORAL_TABLET | ORAL | Status: AC
  Filled 2024-03-29: qty 2

## 2024-03-29 MED ORDER — ONDANSETRON 4 MG PO TBDP: 4.0000 mg | ORAL_TABLET | Freq: Three times a day (TID) | ORAL | 0 refills | Status: AC | PRN

## 2024-03-29 NOTE — Discharge Instructions (Signed)
 Please return to the ED for any new or worsening symptoms.

## 2024-03-29 NOTE — ED Provider Notes (Signed)
 [place] EMERGENCY DEPARTMENT AT [facility] Provider Note   CSN: [id number] Arrival date & time: 03/28/24  2308     Patient presents with: Poisoning   Barry Fry is a 58 y.o. male.  {Add pertinent medical, surgical, social history, OB history to HPI:3773} 58 year old male who presents ER today secondary to ingestion.  Patient ingested a substantial steel cleaner and a yellow can.  States that initially felt okay but then felt a little bit dizzy at home with a dry throat so came in to get evaluated.  Feels little bit better at this time.  No other ingestions.  He got some in his mouth realize what it was and spit it out but he does feel like he ingested a little bit.  Nurse talk to poison control prior to my evaluation and Poison control stated 3-4 hour observation with symptomatic treatment.  Mostly GI irritation.        Prior to Admission medications   Medication Sig Start Date End Date Taking? Authorizing Provider  atorvastatin (LIPITOR) 40 MG tablet Take 40 mg by mouth daily.    [provider]  clarithromycin (BIAXIN XL) 500 MG 24 hr tablet Take 1,000 mg by mouth daily.    [provider]  lisinopril (ZESTRIL) 20 MG tablet Take 10 mg by mouth daily.    [provider]  omeprazole  (PRILOSEC) 40 MG capsule Take 1 capsule (40 mg total) by mouth daily. 05/30/17   Allen, Lauraine CROME, PA-C    Allergies: Patient has no known allergies.    Review of Systems  Updated Vital Signs BP 113/83   Pulse 70   Temp 98.6 F (37 C)   Resp 12   SpO2 100%   Physical Exam Vitals and nursing note reviewed.  Constitutional:      Appearance: He is well-developed.  HENT:     Head: Normocephalic and atraumatic.     Mouth/Throat:     Mouth: Mucous membranes are moist.  Cardiovascular:     Rate and Rhythm: Normal rate.  Pulmonary:     Effort: Pulmonary effort is normal. No respiratory distress.  Abdominal:     General: There is no  distension.  Musculoskeletal:        General: Normal range of motion.     Cervical back: Normal range of motion.  Skin:    General: Skin is warm and dry.  Neurological:     General: No focal deficit present.     Mental Status: He is alert.     (all labs ordered are listed, but only abnormal results are displayed) Labs Reviewed - No data to display  EKG: None  Radiology: No results found.  {Document cardiac monitor, telemetry assessment procedure when appropriate:32947} Procedures   Medications Ordered in the ED - No data to display    {Click here for ABCD2, HEART and other calculators REFRESH Note before signing:1}                              Medical Decision Making Amount and/or Complexity of Data Reviewed ECG/medicine tests: ordered.  Patient appears well.  No neurologic changes.  Will get a i-STAT Chem-8 to check electrolytes and blood sugar along with an EKG secondary to the dizziness she had earlier but it was not very pronounced.  Nonvertiginous. ***  {Document critical care time when appropriate  Document review of labs and clinical decision tools ie CHADS2VASC2, etc  Document your independent review of radiology images and any outside records  Document your discussion with family members, caretakers and with consultants  Document social determinants of health affecting pt's care  Document your decision making why or why not admission, treatments were needed:32947:::1}   Final diagnoses:  None    ED Discharge Orders     None
